# Patient Record
Sex: Male | Born: 1970
Health system: Southern US, Community
[De-identification: ages and names within clinical notes are randomized; demographics above are authoritative.]

## PROBLEM LIST (undated history)

## (undated) HISTORY — PX: EYE SURGERY: SHX253

## (undated) HISTORY — PX: BRAIN SURGERY: SHX531

## (undated) HISTORY — PX: FRACTURE SURGERY: SHX138

---

## 2008-09-06 ENCOUNTER — Inpatient Hospital Stay (HOSPITAL_COMMUNITY): Admission: EM | Admit: 2008-09-06 | Discharge: 2008-09-09 | Payer: Self-pay | Admitting: Emergency Medicine

## 2008-09-12 ENCOUNTER — Emergency Department (HOSPITAL_COMMUNITY): Admission: EM | Admit: 2008-09-12 | Discharge: 2008-09-12 | Payer: Self-pay | Admitting: Emergency Medicine

## 2008-09-14 ENCOUNTER — Emergency Department (HOSPITAL_COMMUNITY): Admission: EM | Admit: 2008-09-14 | Discharge: 2008-09-14 | Payer: Self-pay | Admitting: Emergency Medicine

## 2008-09-14 ENCOUNTER — Ambulatory Visit (HOSPITAL_COMMUNITY): Admission: RE | Admit: 2008-09-14 | Discharge: 2008-09-15 | Payer: Self-pay | Admitting: Otolaryngology

## 2008-11-01 ENCOUNTER — Encounter: Admission: RE | Admit: 2008-11-01 | Discharge: 2008-11-01 | Payer: Self-pay | Admitting: Neurosurgery

## 2009-01-21 ENCOUNTER — Emergency Department (HOSPITAL_COMMUNITY): Admission: EM | Admit: 2009-01-21 | Discharge: 2009-01-21 | Payer: Self-pay | Admitting: Emergency Medicine

## 2009-01-28 ENCOUNTER — Ambulatory Visit (HOSPITAL_COMMUNITY): Admission: RE | Admit: 2009-01-28 | Discharge: 2009-01-28 | Payer: Self-pay | Admitting: Orthopedic Surgery

## 2009-10-22 IMAGING — CT CT HEAD W/O CM
1 of 2 series · 13 of 30 positions shown, 17 images · non-contrast
Comparison: Head CT 09/06/2008.

10/04/2008 – CORRECTED ORDERING PHYSICIAN:  Due to an administrative error, this report was originally sent to the wrong attending physician.  The report now reflects the correct ordering physician.
CLINICAL DATA: Skull fracture.

 CT HEAD WITHOUT CONTRAST
TECHNIQUE: Contiguous axial images were obtained from the base of
 the skull through the vertex without contrast.

[Series 2: brain · axial · 0.49mm/px · z∈[+160,+318]mm · 13 of 36 slices shown, 17 images]
[im 3/36  brain]
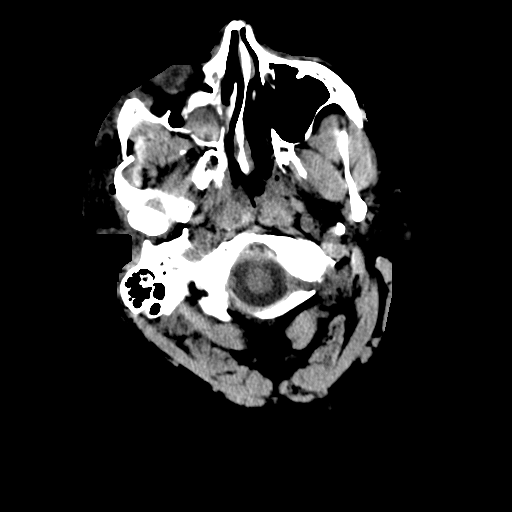
[im 3/36  bone]
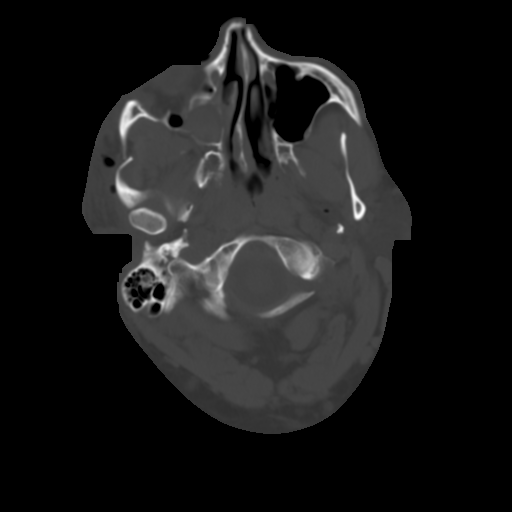
[im 6/36  brain]
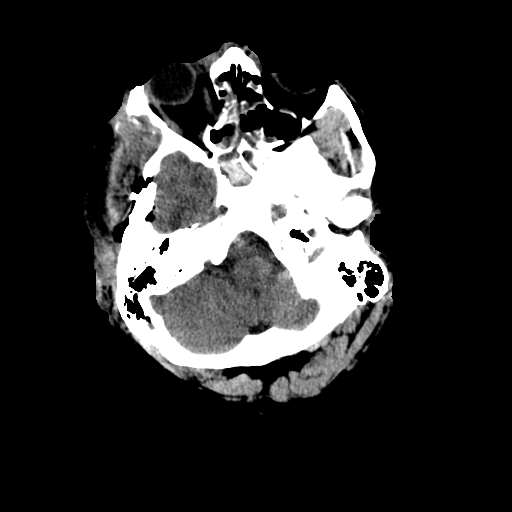
[im 8/36  brain]
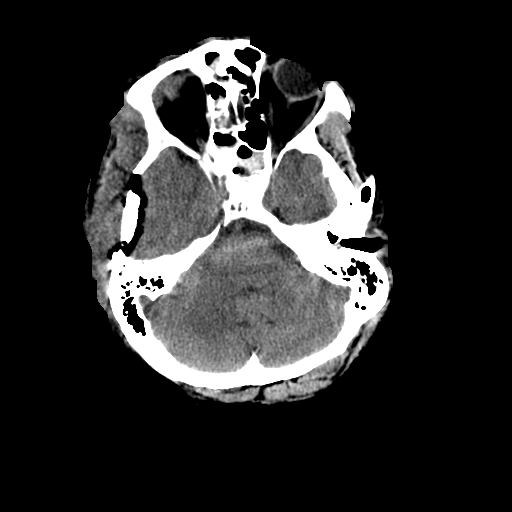
[im 11/36  brain]
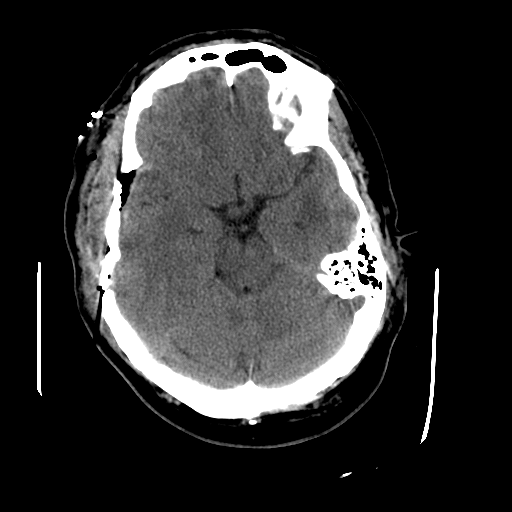
[im 13/36  brain]
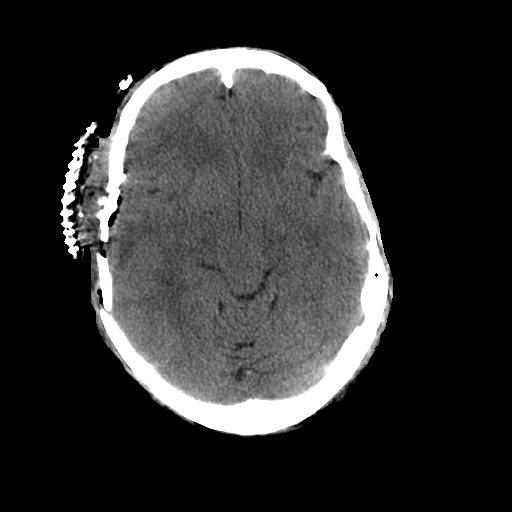
[im 13/36  bone]
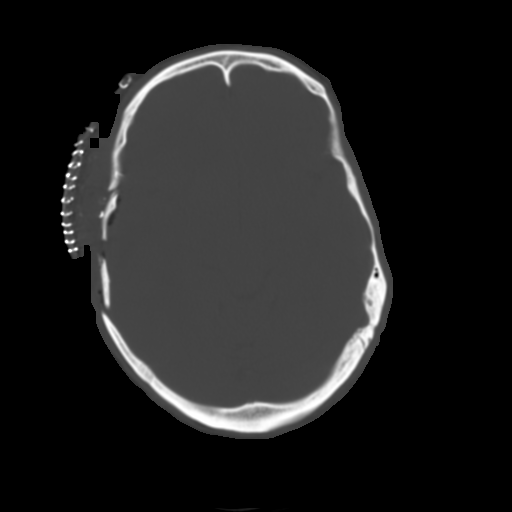
[im 16/36  brain]
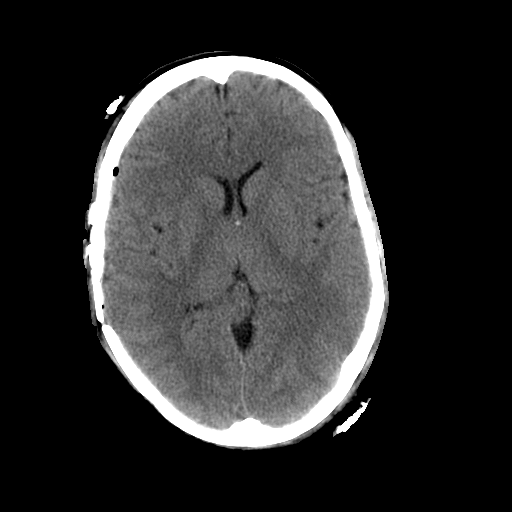
[im 18/36  brain]
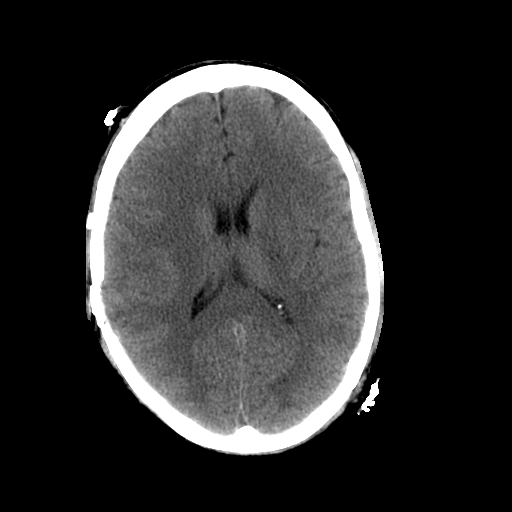
[im 21/36  brain]
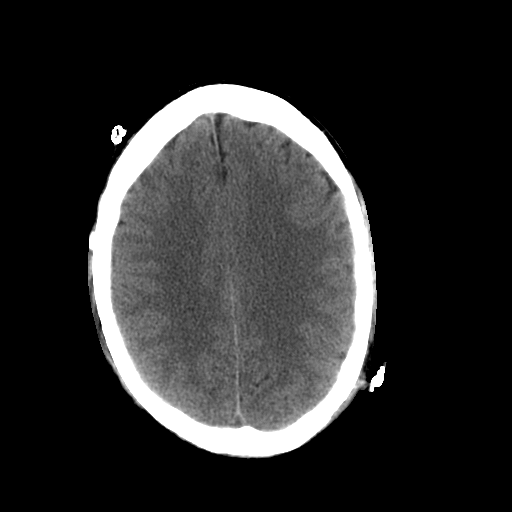
[im 23/36  brain]
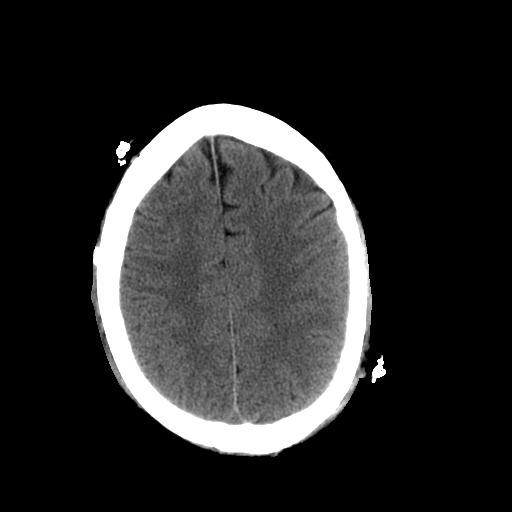
[im 23/36  bone]
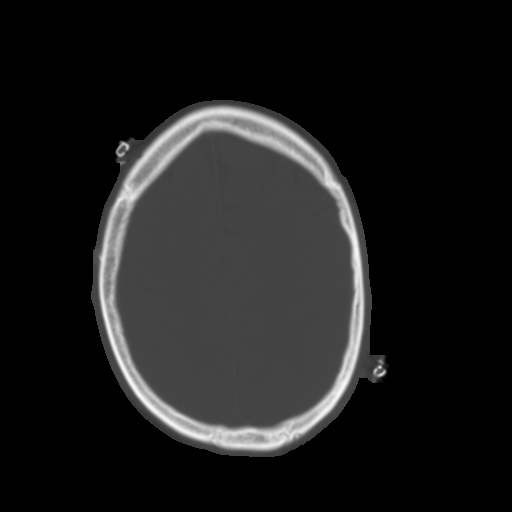
[im 26/36  brain]
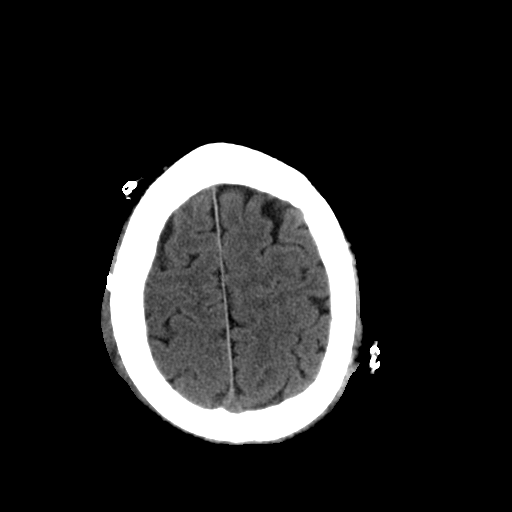
[im 28/36  brain]
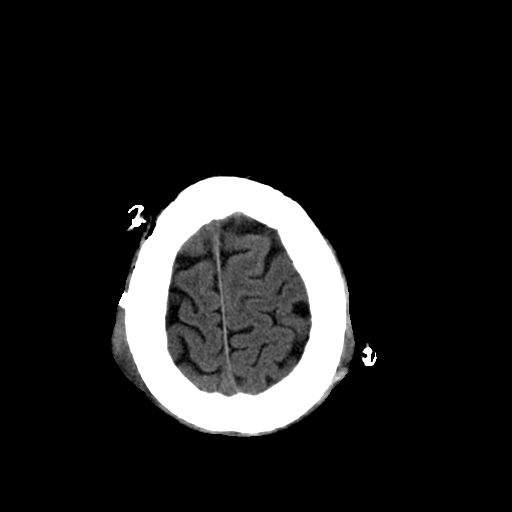
[im 31/36  brain]
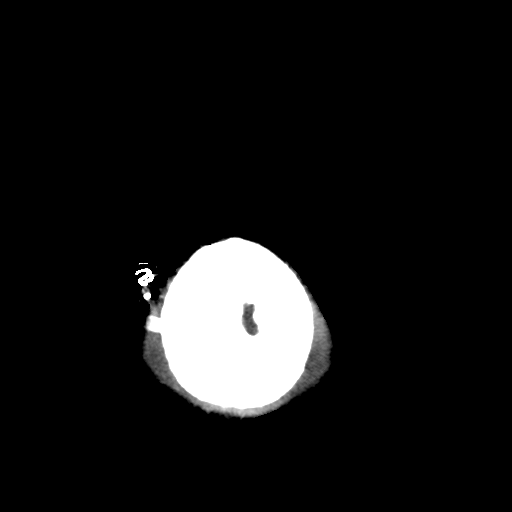
[im 33/36  brain]
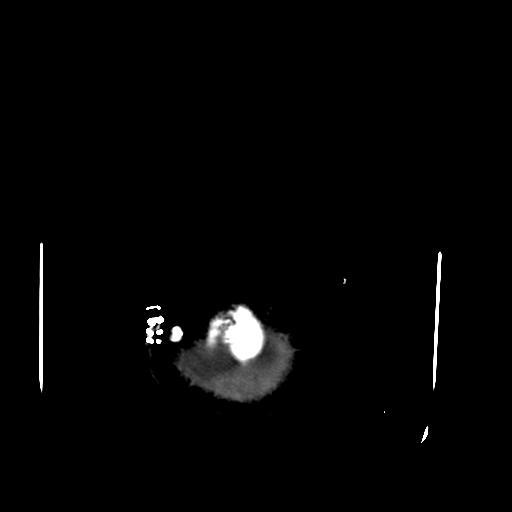
[im 33/36  bone]
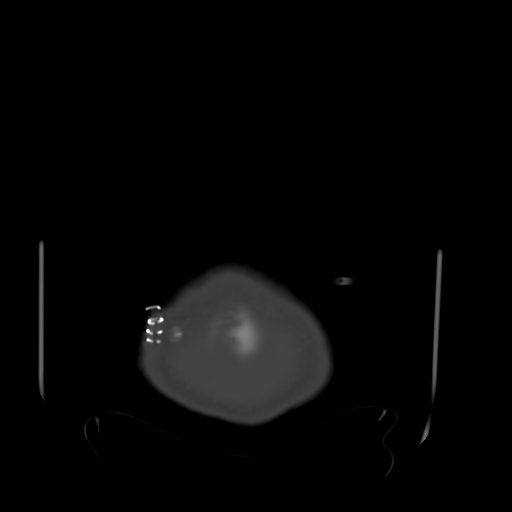

[13 of 30 positions shown; findings below may reference images not displayed]

FINDINGS: Postoperative change of fixation of right side skull
 fractures again noted. There is a small amount of pneumocephalus.
 Tiny amount of epidural blood seen on the prior exam is less
 conspicuous today. A focal area of hypoattenuation now visible in
 the right temporal lobe measuring 2.0 x 1.3 cm immediately
 subjacent to a fracture is likely due to infarction from contusion.
 No other evidence of infarct is identified. No subarachnoid
 hemorrhage, midline shift, hydrocephalus or evidence of is
 identified. Seizures and is now are noted. Multiple right sided
 facial fractures are partially visualized.
IMPRESSION: 1. Status post repair of multiple right side skull fractures.
 2. Small epidural hematoma is seen on the prior study is less
 conspicuous today.
 3. New focal area of hypoattenuation in the right temporal lobe
 not visible on the prior study and compatible with infarct due to
 contusion.

## 2009-12-15 IMAGING — CT CT HEAD W/O CM
2 series · 16 of 30 positions shown, 18 images · non-contrast
Comparison: 09/08/2008.

CLINICAL DATA: Status post repair of a right frontotemporal skull
fracture, right facial fractures and evacuation of a right extra-
axial hematoma.  The patient has right upper teeth numbness and
blurred vision.  Headache.

CT HEAD WITHOUT CONTRAST
TECHNIQUE: Contiguous axial images were obtained from the base of
the skull through the vertex without contrast.

[Series 3: bone windows · axial · 0.49mm/px · z∈[+41,+172]mm · 8 of 64 slices shown]
[im 7/64  bone]
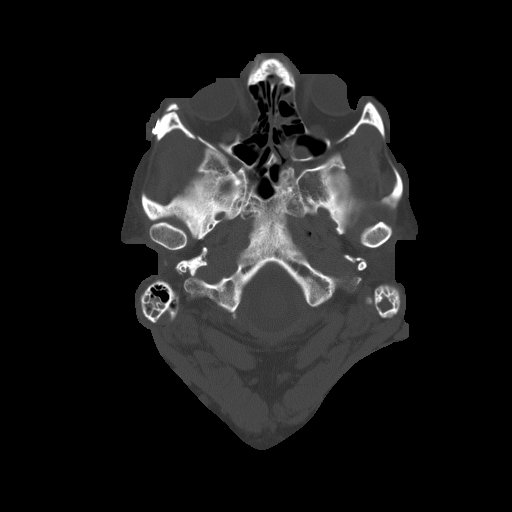
[im 14/64  bone]
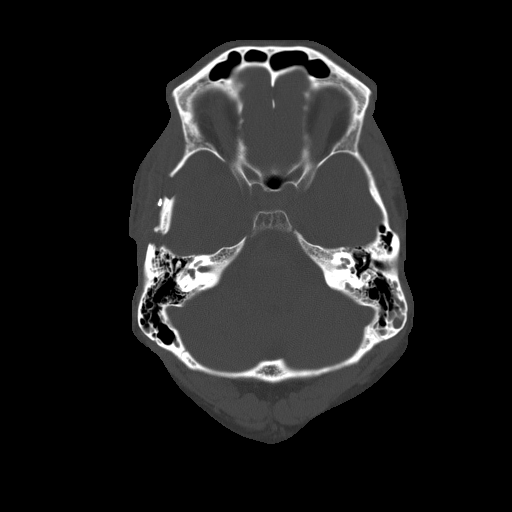
[im 20/64  bone]
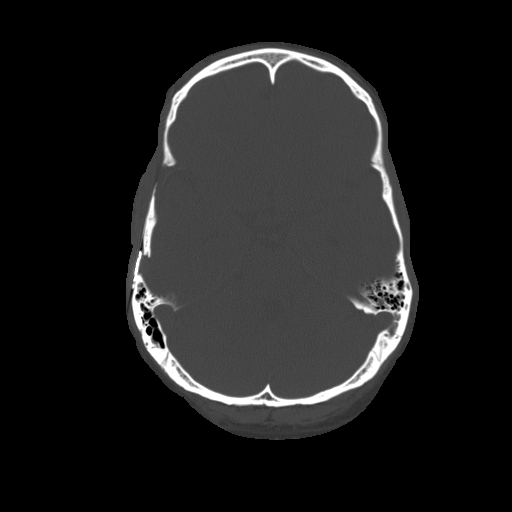
[im 27/64  bone]
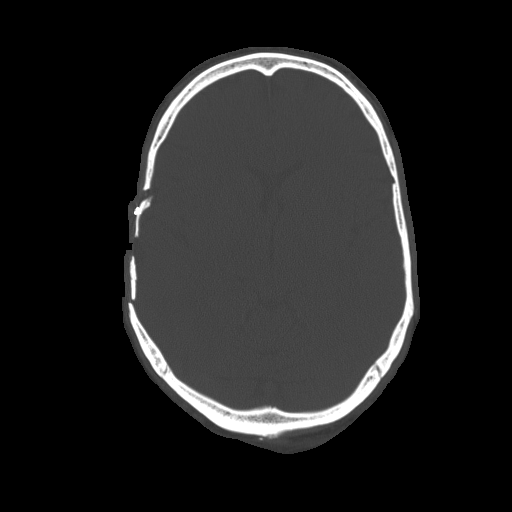
[im 37/64  bone]
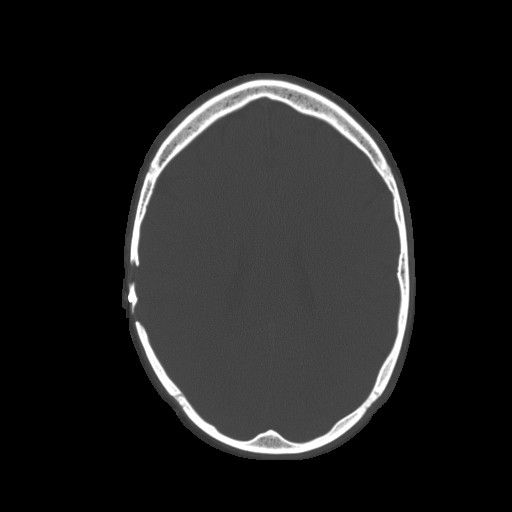
[im 44/64  bone]
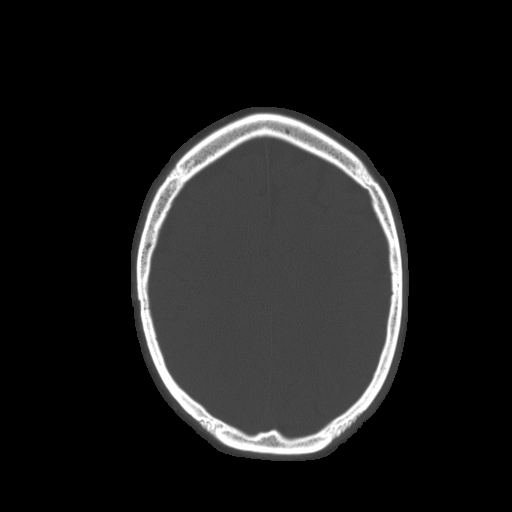
[im 50/64  bone]
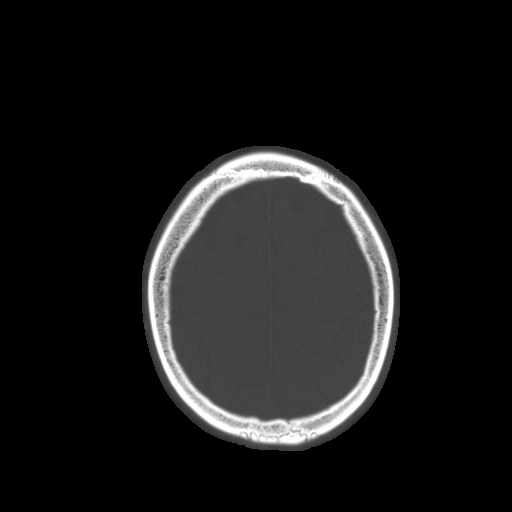
[im 57/64  bone]
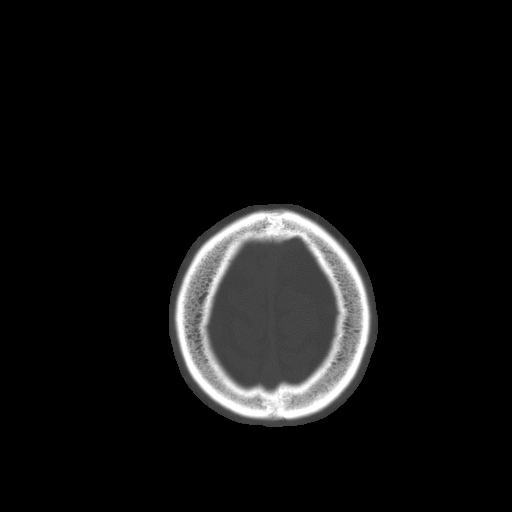

[Series 32: 3d filtered head · axial · 0.49mm/px · z∈[+42,+168]mm · 8 of 32 slices shown, 10 images]
[im 4/32  brain]
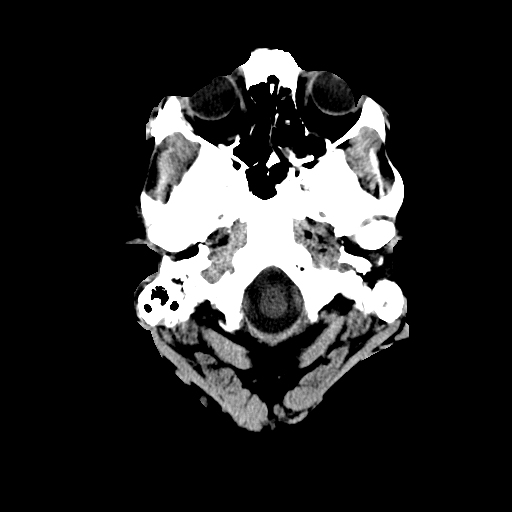
[im 4/32  bone]
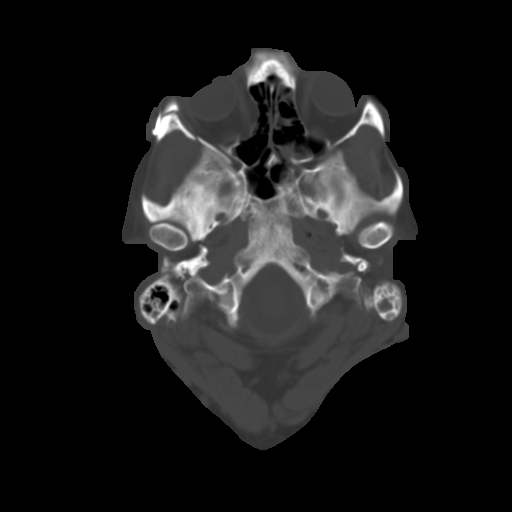
[im 7/32  brain]
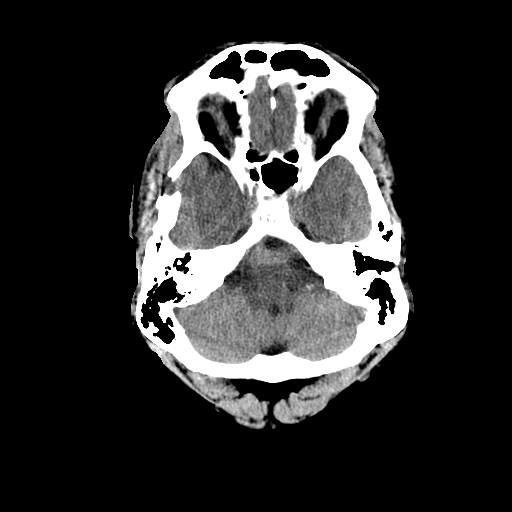
[im 11/32  brain]
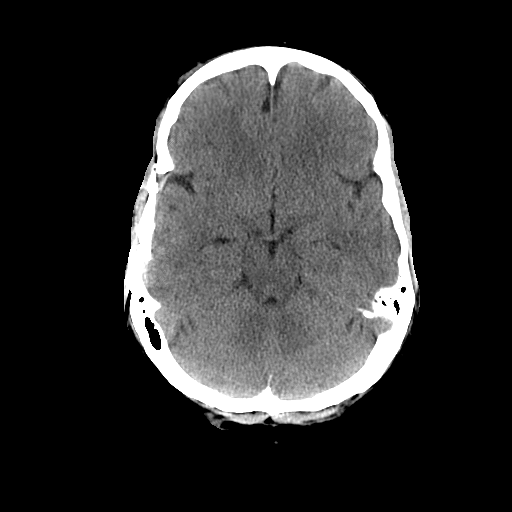
[im 14/32  brain]
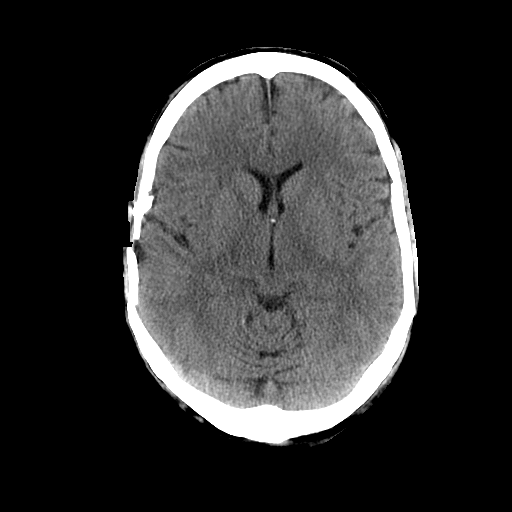
[im 18/32  brain]
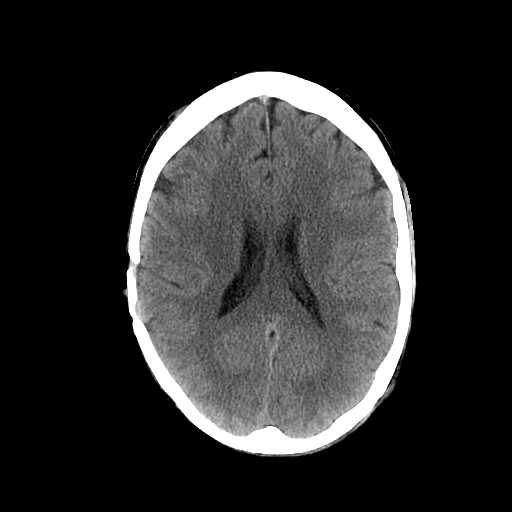
[im 18/32  bone]
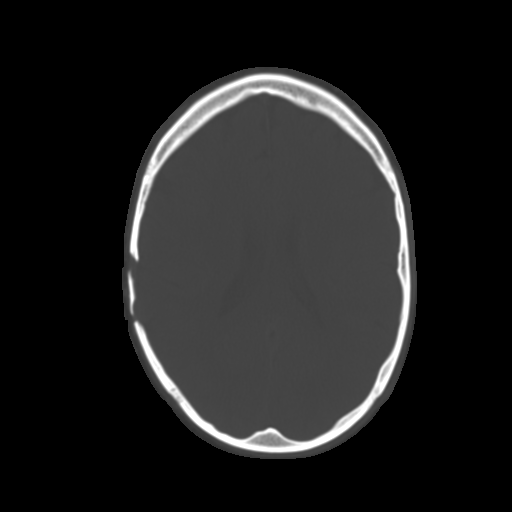
[im 21/32  brain]
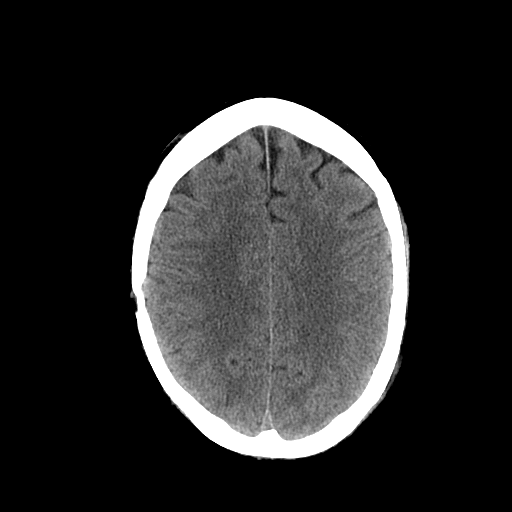
[im 25/32  brain]
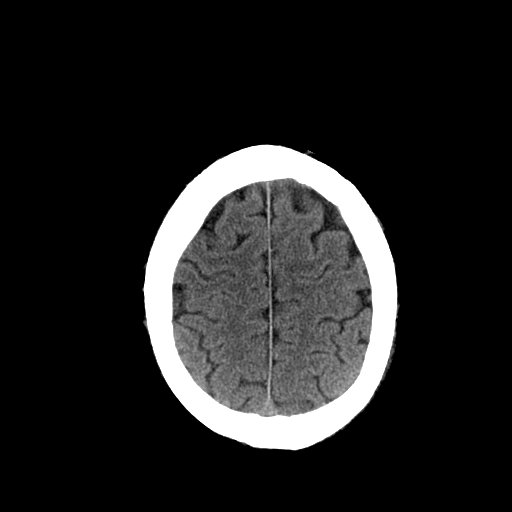
[im 28/32  brain]
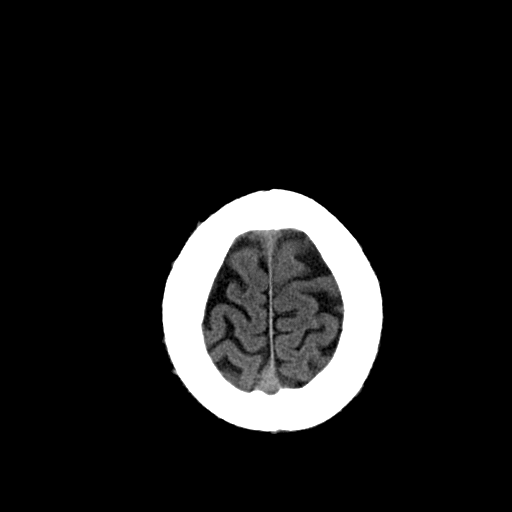

[16 of 30 positions shown; findings below may reference images not displayed]

FINDINGS: Surgical repair of the right frontotemporal skull
fracture is again demonstrated with absence of the previously
demonstrated overlying skin clips and overlying scalp soft tissue
swelling.  The previously demonstrated 2.0 x 1.3 cm area of low
density in the right temporal lobe is smaller, currently measuring
1.0 x 0.6 cm in maximum dimensions on image number 12.  The
remainder of the cerebral hemispheres and posterior fossa
structures have normal appearances.  The ventricles are normal in
size and position.  No extra axial blood is seen at this time.
Partially included and partially repaired right facial fractures
are again demonstrated.  Mild left ethmoid sinus mucosal thickening
and small rounded area of soft tissue density in the left maxillary
sinus.
IMPRESSION: 1.  Interval decrease in the size of a small area of
encephalomalacia in the right temporal lobe.
2.  No extra-axial blood seen at this time.
3.  Mild chronic left ethmoid sinusitis and small left maxillary
sinus retention cyst.
4.  Previously demonstrated right skull and facial bone fractures.

## 2010-07-19 LAB — COMPREHENSIVE METABOLIC PANEL
Albumin: 4.3 g/dL (ref 3.5–5.2)
Alkaline Phosphatase: 52 U/L (ref 39–117)
BUN: 11 mg/dL (ref 6–23)
CO2: 23 mEq/L (ref 19–32)
GFR calc Af Amer: >60 mL/min (ref >60)
Sodium: 133 mEq/L — ABNORMAL LOW (ref 135–145)
Total Protein: 7.2 g/dL (ref 6.0–8.3)

## 2010-07-19 LAB — DIFFERENTIAL
Basophils Absolute: 0 10*3/uL (ref 0.0–0.1)
Basophils Relative: 0 % (ref 0–1)
Monocytes Absolute: 0.9 10*3/uL (ref 0.1–1.0)
Monocytes Relative: 11 % (ref 3–12)

## 2010-07-19 LAB — CBC
HCT: 39.1 % (ref 39.0–52.0)
Hemoglobin: 13.5 g/dL (ref 13.0–17.0)
MCHC: 34.5 g/dL (ref 30.0–36.0)
WBC: 8.2 10*3/uL (ref 4.0–10.5)

## 2010-07-23 LAB — CBC
Hemoglobin: 9.7 g/dL — ABNORMAL LOW (ref 13.0–17.0)
MCHC: 34.3 g/dL (ref 30.0–36.0)
Platelets: 410 10*3/uL — ABNORMAL HIGH (ref 150–400)
RBC: 3.04 MIL/uL — ABNORMAL LOW (ref 4.22–5.81)

## 2010-07-24 LAB — CBC
HCT: 22.6 % — ABNORMAL LOW (ref 39.0–52.0)
HCT: 23.3 % — ABNORMAL LOW (ref 39.0–52.0)
HCT: 23.5 % — ABNORMAL LOW (ref 39.0–52.0)
HCT: 40.7 % (ref 39.0–52.0)
Hemoglobin: 13.9 g/dL (ref 13.0–17.0)
Hemoglobin: 7.9 g/dL — CL (ref 13.0–17.0)
MCHC: 34.1 g/dL (ref 30.0–36.0)
MCHC: 34.9 g/dL (ref 30.0–36.0)
MCV: 90.3 fL (ref 78.0–100.0)
MCV: 91.4 fL (ref 78.0–100.0)
MCV: 92.4 fL (ref 78.0–100.0)
MCV: 92.8 fL (ref 78.0–100.0)
Platelets: 143 10*3/uL — ABNORMAL LOW (ref 150–400)
Platelets: 201 10*3/uL (ref 150–400)
Platelets: 227 10*3/uL (ref 150–400)
RBC: 4.43 MIL/uL (ref 4.22–5.81)
RDW: 12.1 % (ref 11.5–15.5)
RDW: 12.4 % (ref 11.5–15.5)
RDW: 12.4 % (ref 11.5–15.5)
RDW: 12.5 % (ref 11.5–15.5)
WBC: 10.4 10*3/uL (ref 4.0–10.5)
WBC: 18.5 10*3/uL — ABNORMAL HIGH (ref 4.0–10.5)
WBC: 9.9 10*3/uL (ref 4.0–10.5)

## 2010-07-24 LAB — PROTIME-INR
INR: 1.3 (ref 0.00–1.49)
Prothrombin Time: 14.2 seconds (ref 11.6–15.2)
Prothrombin Time: 16.1 seconds — ABNORMAL HIGH (ref 11.6–15.2)
Prothrombin Time: 17.4 seconds — ABNORMAL HIGH (ref 11.6–15.2)

## 2010-07-24 LAB — BASIC METABOLIC PANEL
BUN: 5 mg/dL — ABNORMAL LOW (ref 6–23)
Creatinine, Ser: 0.81 mg/dL (ref 0.4–1.5)
GFR calc non Af Amer: >60 mL/min (ref >60)
Glucose, Bld: 135 mg/dL — ABNORMAL HIGH (ref 70–99)
Potassium: 3.7 mEq/L (ref 3.5–5.1)

## 2010-07-24 LAB — POCT I-STAT, CHEM 8
Chloride: 105 mEq/L (ref 96–112)
HCT: 42 % (ref 39.0–52.0)
Potassium: 3.1 mEq/L — ABNORMAL LOW (ref 3.5–5.1)

## 2010-07-24 LAB — ABO/RH: ABO/RH(D): B NEG

## 2010-07-24 LAB — APTT: aPTT: 36 seconds (ref 24–37)

## 2010-07-24 LAB — TYPE AND SCREEN: ABO/RH(D): B NEG

## 2010-07-24 LAB — CROSSMATCH

## 2010-07-24 LAB — HEMOGLOBIN AND HEMATOCRIT, BLOOD: Hemoglobin: 7.8 g/dL — CL (ref 13.0–17.0)
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
## 2010-08-28 NOTE — Op Note (Signed)
NAME:  Adrian Arnold, Adrian Arnold                   ACCOUNT NO.:  [id number]   MEDICAL RECORD NO.:  [id number]          PATIENT TYPE:  INP   LOCATION:  3106                         FACILITY:  [facility]   PHYSICIAN:  Reinaldo Meeker, M.D. DATE OF BIRTH:  1971/02/03   DATE OF PROCEDURE:  09/06/2008  DATE OF DISCHARGE:                               OPERATIVE REPORT   PREOPERATIVE DIAGNOSIS:  Depressed skull fracture, right  temporoparietal.   POSTOPERATIVE DIAGNOSIS:  Depressed skull fracture, right  temporoparietal.   PROCEDURE:  Elevation of right frontotemporal parietal depressed skull  fracture with evacuation of epidural and subdural hematoma.   SURGEON:  Reinaldo Meeker, MD.   ADDITIONAL PROCEDURE:  Repair of left parietal laceration.   PROCEDURE IN DETAIL:  After placed in the supine position, the patient's  scalp on the left side by his laceration was shaved and cleaned and  prepped.  The incision was explored and the hematoma was evacuated.  A  single line of staples were placed along the laceration line.  This gave  excellent hemostasis.  The patient was then rolled slightly towards the  left side with a point along his right, and the right scalp and hair  region were shaved, prepped and draped.  The very large V-shaped  laceration was simply elevated towards the posterior.  We then dissected  free the edges of the cranium, so that we could isolate the depressed  skull fracture region.  Once this was done, a self-retaining retractors  and towel clips were used to expose the area on all margins.  The  depressed skull fracture was evaluated and few of the pieces could be  removed.  This gave [place] access to work under some of the larger fragments  and the skull fracture was removed in a piecemeal fashion.  There was  one large piece with a very sharply leading edge that had lacerated the  dura and actually lacerated the cortical surface.  This was removed and  there was firm amount of brisk  venous bleeding.  The dura was opened  somewhat larger, so we could evaluate and coagulate the area.  The  subdural clot was evacuated in this region and the small bleeding vein  was coagulated and then Gelfoam was placed upon it.  Exploration under  the rest that have suppressed skull fracture showed some epidural clots  and this was removed without difficulty.  The dura was then closed once  more.  We checked around the edges of the cranial defect to make sure  there were no more depressed skull fragments, and there were none could  be identified.  The larger fragments of bone were then reapproximated to  cover approximately 80-85% of the cranial defect.  These were secured  above to each other into the cranium with small screws in the wound  plates and 4 mm screws.  A subdural drain was then left in the subdural  space and brought in through a separate stab wound incision.  The long  laceration was then closed with inverted Vicryl on the galea and  staples  on the skin.  We stopped short of the region of the eye, so that the  maxillofacial surgery could come in and close that in a separate  procedure.  During the procedure, I had anticipated the patient will be  extubated and taken to recovery room.           ______________________________  Reinaldo Meeker, M.D.    ROK/MEDQ  D:  09/06/2008  T:  09/07/2008  Job:  [id number]

## 2019-09-09 ENCOUNTER — Ambulatory Visit: Payer: BC Managed Care – PPO | Admitting: Family Medicine

## 2019-09-09 ENCOUNTER — Encounter: Payer: Self-pay | Admitting: Family Medicine

## 2019-09-09 ENCOUNTER — Other Ambulatory Visit: Payer: Self-pay

## 2019-09-09 VITALS — BP 108/73 | HR 70 | Temp 97.5°F | Resp 16 | Ht 69.5 in | Wt 222.2 lb

## 2019-09-09 DIAGNOSIS — H8110 Benign paroxysmal vertigo, unspecified ear: Secondary | ICD-10-CM | POA: Insufficient documentation

## 2019-09-09 DIAGNOSIS — Z720 Tobacco use: Secondary | ICD-10-CM | POA: Insufficient documentation

## 2019-09-09 MED ORDER — MECLIZINE HCL 25 MG PO TABS: 50.0000 mg | ORAL_TABLET | Freq: Three times a day (TID) | ORAL | 1 refills | Status: DC | PRN

## 2019-09-09 MED ORDER — FLUTICASONE PROPIONATE 50 MCG/ACT NA SUSP: 2.0000 | Freq: Every day | NASAL | 6 refills | Status: DC

## 2019-09-09 NOTE — Assessment & Plan Note (Signed)
3-5 minute discussion regarding the harms of tobacco use, the benefits of cessation, and methods of cessation Discussed that there are medication options to help with cessation Patient is currently precontemplative  Will reassess at next visit  

## 2019-09-09 NOTE — Progress Notes (Signed)
New patient visit   Patient: Adrian Arnold   DOB: Nov 07, 1970   49 y.o. Male  MRN: [id number] Visit Date: 09/09/2019  I,Sulibeya S Dimas,acting as a scribe for Lavon Paganini, MD.,have documented all relevant documentation on the behalf of Lavon Paganini, MD,as directed by  Lavon Paganini, MD while in the presence of Lavon Paganini, MD.  Today's healthcare provider: Lavon Paganini, MD   Chief Complaint  Patient presents with  . New Patient (Initial Visit)  . Dizziness   Subjective    Adrian Arnold is a 49 y.o. male who presents today as a new patient to establish care.  HPI   Dizziness  He reports new onset dizziness. He describes it as feeling light headed, feeling like room is spinning and feeling unbalanced, occurs intermittently, and typically lasts minutes.  It typically occurs when he is lying back from sitting position and sitting up from lying position. Or turning head quickly. It is usually relieved by sitting still. He has not started new medications around the time the dizziness started.  Never had this previously  Associated symptoms: No hearing loss No tinnitus  No chest discomfort No heart palpitations  No heart racing No numbness or tingling of extremities  Yes nausea No vomiting  No speech difficulty No visual changes    Wt Readings from Last 3 Encounters:  09/09/19 222 lb 3.2 oz (100.8 kg)    BP Readings from Last 3 Encounters:  09/09/19 108/73      Lab Results  Component Value Date   WBC 8.2 01/28/2009   HGB 13.5 01/28/2009   HCT 39.1 01/28/2009   MCV 91.3 01/28/2009   PLT 266 01/28/2009   Lab Results  Component Value Date   NA 133 (L) 01/28/2009   K 3.4 (L) 01/28/2009   CO2 23 01/28/2009   BUN 11 01/28/2009   CREATININE 0.79 01/28/2009   CALCIUM 9.0 01/28/2009   GLUCOSE 121 (H) 01/28/2009     ---------------------------------------------------------------------------------------------------   History reviewed. No pertinent  past medical history. Past Surgical History:  Procedure Laterality Date  . BRAIN SURGERY     skull fusion, nothing intracranial  . EYE SURGERY    . FRACTURE SURGERY     Family Status  Relation Name Status  . Mother  Alive  . Father  Deceased at age 77       prostate cancer  . Sister  Alive  . Brother  Alive  . Son  Alive  . MGF  Deceased  . PGM  Deceased  . Mat Uncle  (Not Specified)  . Neg Hx  (Not Specified)   Family History  Problem Relation Age of Onset  . Heart attack Mother 39  . Stroke Mother 26  . Prostate cancer Father 36  . Gallbladder disease Sister   . Gallbladder disease Brother   . Heart attack Maternal Grandfather   . Stroke Maternal Grandfather   . Heart attack Paternal Grandmother   . Colon cancer Maternal Uncle   . Breast cancer Neg Hx    Social History   Socioeconomic History  . Marital status: Married    Spouse name: Not on file  . Number of children: 1  . Years of education: Not on file  . Highest education level: Not on file  Occupational History  . Not on file  Tobacco Use  . Smoking status: Former Smoker    Packs/day: 1.00    Years: 20.00    Pack years: 20.00  Types: Cigarettes    Quit date: 2015    Years since quitting: 6.4  . Smokeless tobacco: Current User    Types: Snuff  Substance and Sexual Activity  . Alcohol use: Yes    Alcohol/week: 2.0 standard drinks    Types: 2 Cans of beer per week  . Drug use: Never  . Sexual activity: Yes    Partners: Female    Birth control/protection: None  Other Topics Concern  . Not on file  Social History Narrative  . Not on file   Social Determinants of Health   Financial Resource Strain:   . Difficulty of Paying Living Expenses:   Food Insecurity:   . Worried About Charity fundraiser in the Last Year:   . Arboriculturist in the Last Year:   Transportation Needs:   . Film/video editor (Medical):   Marland Kitchen Lack of Transportation (Non-Medical):   Physical Activity:   . Days of  Exercise per Week:   . Minutes of Exercise per Session:   Stress:   . Feeling of Stress :   Social Connections:   . Frequency of Communication with Friends and Family:   . Frequency of Social Gatherings with Friends and Family:   . Attends Religious Services:   . Active Member of Clubs or Organizations:   . Attends Archivist Meetings:   Marland Kitchen Marital Status:    No outpatient medications prior to visit.   No facility-administered medications prior to visit.   No Known Allergies   There is no immunization history on file for this patient.  Health Maintenance  Topic Date Due  . COVID-19 Vaccine (1) Never done  . HIV Screening  Never done  . TETANUS/TDAP  Never done  . COLONOSCOPY  Never done  . INFLUENZA VACCINE  11/14/2019    Patient Care Team: Virginia Crews, MD as PCP - General (Family Medicine)  Review of Systems  Constitutional: Negative.   HENT: Negative.   Eyes: Negative.   Respiratory: Negative.   Cardiovascular: Negative.   Gastrointestinal: Negative.   Endocrine: Negative.   Genitourinary: Negative.   Musculoskeletal: Positive for arthralgias and myalgias.  Skin: Negative.   Allergic/Immunologic: Negative.   Neurological: Positive for dizziness and light-headedness.  Hematological: Negative.   Psychiatric/Behavioral: Negative.      Objective    BP 108/73 (BP Location: Left Arm, Patient Position: Sitting, Cuff Size: Large)   Pulse 70   Temp (!) 97.5 F (36.4 C) (Temporal)   Resp 16   Ht 5' 9.5" (1.765 m)   Wt 222 lb 3.2 oz (100.8 kg)   BMI 32.34 kg/m  Physical Exam Vitals reviewed.  Constitutional:      General: He is not in acute distress.    Appearance: Normal appearance. He is not diaphoretic.  HENT:     Head: Normocephalic and atraumatic.     Right Ear: Tympanic membrane, ear canal and external ear normal.     Left Ear: Tympanic membrane, ear canal and external ear normal.     Ears:     Comments: +Dix Hallpike     Mouth/Throat:     Mouth: Mucous membranes are moist.     Pharynx: Oropharynx is clear.  Eyes:     General: No scleral icterus.    Extraocular Movements: Extraocular movements intact.     Right eye: No nystagmus.     Left eye: No nystagmus.     Conjunctiva/sclera: Conjunctivae normal.  Pupils: Pupils are equal, round, and reactive to light.  Cardiovascular:     Rate and Rhythm: Normal rate and regular rhythm.     Pulses: Normal pulses.     Heart sounds: Normal heart sounds. No murmur.  Pulmonary:     Effort: Pulmonary effort is normal. No respiratory distress.     Breath sounds: Normal breath sounds. No wheezing or rhonchi.  Abdominal:     General: Abdomen is flat. There is no distension.     Palpations: Abdomen is soft.     Tenderness: There is no abdominal tenderness.  Musculoskeletal:     Cervical back: Normal range of motion and neck supple.     Right lower leg: No edema.     Left lower leg: No edema.  Skin:    General: Skin is warm and dry.     Findings: No rash.  Neurological:     General: No focal deficit present.     Mental Status: He is alert and oriented to person, place, and time.     Cranial Nerves: No cranial nerve deficit.     Sensory: No sensory deficit.     Motor: No weakness.     Coordination: Romberg sign negative. Coordination normal. Finger-Nose-Finger Test normal.     Gait: Gait normal.  Psychiatric:        Mood and Affect: Mood normal.        Behavior: Behavior normal.        Thought Content: Thought content normal.    Depression Screen PHQ 2/9 Scores 09/09/2019  PHQ - 2 Score 1  PHQ- 9 Score 3   No results found for any visits on 09/09/19.  Assessment & Plan      Problem List Items Addressed This Visit      Nervous and Auditory   Benign paroxysmal positional vertigo - Primary    History and exam consistent with BPPV Neuro intact +Dix hallpike Will treat with meclizine and flonase Consider adding zofran if nausea worsens Discussed  fall precautions and return precautions      Relevant Medications   fluticasone (FLONASE) 50 MCG/ACT nasal spray   meclizine (ANTIVERT) 25 MG tablet     Other   Chewing tobacco use    3-5 minute discussion regarding the harms of tobacco use, the benefits of cessation, and methods of cessation Discussed that there are medication options to help with cessation Patient is currently precontemplative  Will reassess at next visit         Total time spent on today's visit was greater than 30 minutes, including both face-to-face time and nonface-to-face time personally spent on review of chart (labs and imaging), discussing labs and goals, discussing further work-up, treatment options, referrals to specialist if needed, reviewing outside records of pertinent, answering patient's questions, and coordinating care.   Return for as scheduled.     I, Lavon Paganini, MD, have reviewed all documentation for this visit. The documentation on 09/09/19 for the exam, diagnosis, procedures, and orders are all accurate and complete.   Zanovia Rotz, Dionne Bucy, MD, MPH [facility]

## 2019-09-09 NOTE — Assessment & Plan Note (Signed)
History and exam consistent with BPPV Neuro intact +Dix hallpike Will treat with meclizine and flonase Consider adding zofran if nausea worsens Discussed fall precautions and return precautions

## 2019-09-09 NOTE — Patient Instructions (Addendum)
Benign Positional Vertigo Vertigo is the feeling that you or your surroundings are moving when they are not. Benign positional vertigo is the most common form of vertigo. This is usually a harmless condition (benign). This condition is positional. This means that symptoms are triggered by certain movements and positions. This condition can be dangerous if it occurs while you are doing something that could cause harm to you or others. This includes activities such as driving or operating machinery. What are the causes? In many cases, the cause of this condition is not known. It may be caused by a disturbance in an area of the inner ear that helps your brain to sense movement and balance. This disturbance can be caused by:  Viral infection (labyrinthitis).  Head injury.  Repetitive motion, such as jumping, dancing, or running. What increases the risk? You are more likely to develop this condition if:  You are a woman.  You are 50 years of age or older. What are the signs or symptoms? Symptoms of this condition usually happen when you move your head or your eyes in different directions. Symptoms may start suddenly, and usually last for less than a minute. They include:  Loss of balance and falling.  Feeling like you are spinning or moving.  Feeling like your surroundings are spinning or moving.  Nausea and vomiting.  Blurred vision.  Dizziness.  Involuntary eye movement (nystagmus). Symptoms can be mild and cause only minor problems, or they can be severe and interfere with daily life. Episodes of benign positional vertigo may return (recur) over time. Symptoms may improve over time. How is this diagnosed? This condition may be diagnosed based on:  Your medical history.  Physical exam of the head, neck, and ears.  Tests, such as: ? MRI. ? CT scan. ? Eye movement tests. Your health care provider may ask you to change positions quickly while he or she watches you for symptoms  of benign positional vertigo, such as nystagmus. Eye movement may be tested with a variety of exams that are designed to evaluate or stimulate vertigo. ? An electroencephalogram (EEG). This records electrical activity in your brain. ? Hearing tests. You may be referred to a health care provider who specializes in ear, nose, and throat (ENT) problems (otolaryngologist) or a provider who specializes in disorders of the nervous system (neurologist). How is this treated?  This condition may be treated in a session in which your health care provider moves your head in specific positions to adjust your inner ear back to normal. Treatment for this condition may take several sessions. Surgery may be needed in severe cases, but this is rare. In some cases, benign positional vertigo may resolve on its own in 2-4 weeks. Follow these instructions at home: Safety  Move slowly. Avoid sudden body or head movements or certain positions, as told by your health care provider.  Avoid driving until your health care provider says it is safe for you to do so.  Avoid operating heavy machinery until your health care provider says it is safe for you to do so.  Avoid doing any tasks that would be dangerous to you or others if vertigo occurs.  If you have trouble walking or keeping your balance, try using a cane for stability. If you feel dizzy or unstable, sit down right away.  Return to your normal activities as told by your health care provider. Ask your health care provider what activities are safe for you. General instructions  Take over-the-counter   and prescription medicines only as told by your health care provider.  Drink enough fluid to keep your urine pale yellow.  Keep all follow-up visits as told by your health care provider. This is important. Contact a health care provider if:  You have a fever.  Your condition gets worse or you develop new symptoms.  Your family or friends notice any  behavioral changes.  You have nausea or vomiting that gets worse.  You have numbness or a "pins and needles" sensation. Get help right away if you:  Have difficulty speaking or moving.  Are always dizzy.  Faint.  Develop severe headaches.  Have weakness in your legs or arms.  Have changes in your hearing or vision.  Develop a stiff neck.  Develop sensitivity to light. Summary  Vertigo is the feeling that you or your surroundings are moving when they are not. Benign positional vertigo is the most common form of vertigo.  The cause of this condition is not known. It may be caused by a disturbance in an area of the inner ear that helps your brain to sense movement and balance.  Symptoms include loss of balance and falling, feeling that you or your surroundings are moving, nausea and vomiting, and blurred vision.  This condition can be diagnosed based on symptoms, physical exam, and other tests, such as MRI, CT scan, eye movement tests, and hearing tests.  Follow safety instructions as told by your health care provider. You will also be told when to contact your health care provider in case of problems. This information is not intended to replace advice given to you by your health care provider. Make sure you discuss any questions you have with your health care provider. Document Revised: 09/10/2017 Document Reviewed: 09/10/2017 Elsevier Patient Education  2020 Else[employer] Inc.  How to Perform the Epley Maneuver The Epley maneuver is an exercise that relieves symptoms of vertigo. Vertigo is the feeling that you or your surroundings are moving when they are not. When you feel vertigo, you may feel like the room is spinning and have trouble walking. Dizziness is a little different than vertigo. When you are dizzy, you may feel unsteady or light-headed. You can do this maneuver at home whenever you have symptoms of vertigo. You can do it up to 3 times a day until your symptoms go  away. Even though the Epley maneuver may relieve your vertigo for a few weeks, it is possible that your symptoms will return. This maneuver relieves vertigo, but it does not relieve dizziness. What are the risks? If it is done correctly, the Epley maneuver is considered safe. Sometimes it can lead to dizziness or nausea that goes away after a short time. If you develop other symptoms, such as changes in vision, weakness, or numbness, stop doing the maneuver and call your health care provider. How to perform the Epley maneuver 1. Sit on the edge of a bed or table with your back straight and your legs extended or hanging over the edge of the bed or table. 2. Turn your head halfway toward the affected ear or side. 3. Lie backward quickly with your head turned until you are lying flat on your back. You may want to position a pillow under your shoulders. 4. Hold this position for 30 seconds. You may experience an attack of vertigo. This is normal. 5. Turn your head to the opposite direction until your unaffected ear is facing the floor. 6. Hold this position for 30 seconds. You   may experience an attack of vertigo. This is normal. Hold this position until the vertigo stops. 7. Turn your whole body to the same side as your head. Hold for another 30 seconds. 8. Sit back up. You can repeat this exercise up to 3 times a day. Follow these instructions at home:  After doing the Epley maneuver, you can return to your normal activities.  Ask your health care provider if there is anything you should do at home to prevent vertigo. He or she may recommend that you: ? Keep your head raised (elevated) with two or more pillows while you sleep. ? Do not sleep on the side of your affected ear. ? Get up slowly from bed. ? Avoid sudden movements during the day. ? Avoid extreme head movement, like looking up or bending over. Contact a health care provider if:  Your vertigo gets worse.  You have other symptoms,  including: ? Nausea. ? Vomiting. ? Headache. Get help right away if:  You have vision changes.  You have a severe or worsening headache or neck pain.  You cannot stop vomiting.  You have new numbness or weakness in any part of your body. Summary  Vertigo is the feeling that you or your surroundings are moving when they are not.  The Epley maneuver is an exercise that relieves symptoms of vertigo.  If the Epley maneuver is done correctly, it is considered safe. You can do it up to 3 times a day. This information is not intended to replace advice given to you by your health care provider. Make sure you discuss any questions you have with your health care provider. Document Revised: 03/14/2017 Document Reviewed: 02/20/2016 Elsevier Patient Education  2020 Elsevier Inc.   

## 2019-10-07 ENCOUNTER — Encounter: Payer: Self-pay | Admitting: Family Medicine

## 2019-10-11 ENCOUNTER — Encounter: Payer: Self-pay | Admitting: Family Medicine

## 2019-10-15 ENCOUNTER — Telehealth: Payer: Self-pay

## 2019-10-15 ENCOUNTER — Other Ambulatory Visit: Payer: Self-pay | Admitting: Family Medicine

## 2019-10-15 DIAGNOSIS — H8110 Benign paroxysmal vertigo, unspecified ear: Secondary | ICD-10-CM

## 2019-10-15 MED ORDER — MECLIZINE HCL 25 MG PO TABS: 50.0000 mg | ORAL_TABLET | Freq: Three times a day (TID) | ORAL | 1 refills | Status: DC | PRN

## 2019-10-15 NOTE — Telephone Encounter (Signed)
Left message advising pt he can schedule a physical with another provider if he needs it sooner.  PEC please offer an appointment with a different provider.when he calls back.   Thanks,   -Mickel Baas

## 2019-10-15 NOTE — Telephone Encounter (Signed)
  Patient requesting meclizine (ANTIVERT) 25 MG tablet, informed patient please allow 48 to 72 hour turn around time  [id number] - [place], [place] - [address] Phone:  [phone]  Fax:  [phone]

## 2019-10-15 NOTE — Telephone Encounter (Signed)
Requested medication (s) are due for refill today: no  Requested medication (s) are on the active medication list: yes  Last refill: 09/09/2019  Future visit scheduled: yes  Notes to clinic:  this refill cannot be delegated    Requested Prescriptions  Pending Prescriptions Disp Refills   meclizine (ANTIVERT) 25 MG tablet 60 tablet 1    Sig: Take 2 tablets (50 mg total) by mouth 3 (three) times daily as needed.      Not Delegated - Gastroenterology: Antiemetics Failed - 10/15/2019  9:02 AM      Failed - This refill cannot be delegated      Passed - Valid encounter within last 6 months    Recent Outpatient Visits           1 month ago Benign paroxysmal positional vertigo, unspecified laterality   [facility] Bacigalupo, Dionne Bucy, MD       Future Appointments             In 4 months Bacigalupo, Dionne Bucy, MD [facility], [place]

## 2019-10-15 NOTE — Telephone Encounter (Signed)
Copied from [place] [id number]. Topic: General - Other >> Oct 15, 2019  9:01 AM Oneta Rack wrote: Reason for CRM:  Patient returned call to [facility] his physical due to the provider, Patient [facility] to next available physical slot 03/01/2020. Patient placed on wait list but would like to know when PCP returns can he be worked in for a physical.

## 2019-10-22 NOTE — Progress Notes (Signed)
Trena Platt Cummings,acting as a Education administrator for [employer]have documented all relevant documentation on the behalf of [employer],as directed by  [employer] while in the presence of [employer], [place].  Complete physical exam   Patient: Adrian Arnold   DOB: 1971/03/20   49 y.o. Male  MRN: [id number] Visit Date: 10/26/2019  Today's healthcare provider: Vernie Murders, PA   Chief Complaint  Patient presents with  . Annual Exam   Subjective    Adrian Arnold is a 49 y.o. male who presents today for a complete physical exam.  He reports consuming a general and low sodium diet. The patient has a physically strenuous job, but has no regular exercise apart from work.  He generally feels well. He reports sleeping fairly well. He does not have additional problems to discuss today.    No past medical history on file.   Past Surgical History:  Procedure Laterality Date  . BRAIN SURGERY     skull fusion, nothing intracranial  . EYE SURGERY    . FRACTURE SURGERY     Social History   Socioeconomic History  . Marital status: Married    Spouse name: Not on file  . Number of children: 1  . Years of education: Not on file  . Highest education level: Not on file  Occupational History  . Not on file  Tobacco Use  . Smoking status: Former Smoker    Packs/day: 1.00    Years: 20.00    Pack years: 20.00    Types: Cigarettes    Quit date: 2015    Years since quitting: 6.5  . Smokeless tobacco: Current User    Types: Snuff  Vaping Use  . Vaping Use: Never used  Substance and Sexual Activity  . Alcohol use: Yes    Alcohol/week: 2.0 standard drinks    Types: 2 Cans of beer per week  . Drug use: Never  . Sexual activity: Yes    Partners: Female    Birth control/protection: None  Other Topics Concern  . Not on file  Social History Narrative  . Not on file   Social Determinants of Health   Financial Resource Strain:   . Difficulty of Paying Living Expenses:    Food Insecurity:   . Worried About Charity fundraiser in the Last Year:   . Arboriculturist in the Last Year:   Transportation Needs:   . Film/video editor (Medical):   Marland Kitchen Lack of Transportation (Non-Medical):   Physical Activity:   . Days of Exercise per Week:   . Minutes of Exercise per Session:   Stress:   . Feeling of Stress :   Social Connections:   . Frequency of Communication with Friends and Family:   . Frequency of Social Gatherings with Friends and Family:   . Attends Religious Services:   . Active Member of Clubs or Organizations:   . Attends Archivist Meetings:   Marland Kitchen Marital Status:   Intimate Partner Violence:   . Fear of Current or Ex-Partner:   . Emotionally Abused:   Marland Kitchen Physically Abused:   . Sexually Abused:    Family Status  Relation Name Status  . Mother  Alive  . Father  Deceased at age 55       prostate cancer  . Sister  Alive  . Brother  Alive  . Son  Alive  . MGF  Deceased  . PGM  Deceased  . Mat Uncle  (Not Specified)  . Neg Hx  (Not Specified)   Family History  Problem Relation Age of Onset  . Heart attack Mother 70  . Stroke Mother 15  . Prostate cancer Father 70  . Gallbladder disease Sister   . Gallbladder disease Brother   . Heart attack Maternal Grandfather   . Stroke Maternal Grandfather   . Heart attack Paternal Grandmother   . Colon cancer Maternal Uncle   . Breast cancer Neg Hx    No Known Allergies   Patient Care Team: Virginia Crews, MD as PCP - General (Family Medicine)   Medications: Outpatient Medications Prior to Visit  Medication Sig  . fluticasone (FLONASE) 50 MCG/ACT nasal spray Place 2 sprays into both nostrils daily.  . meclizine (ANTIVERT) 25 MG tablet Take 2 tablets (50 mg total) by mouth 3 (three) times daily as needed.   No facility-administered medications prior to visit.    Review of Systems  Constitutional: Negative.   HENT: Positive for congestion.   Eyes: Negative.    Respiratory: Positive for wheezing.   Cardiovascular: Negative.   Gastrointestinal: Negative.   Endocrine: Negative.   Genitourinary: Negative.   Musculoskeletal: Positive for arthralgias.  Skin: Negative.   Allergic/Immunologic: Negative.   Neurological: Positive for dizziness and light-headedness.  Hematological: Negative.   Psychiatric/Behavioral: Negative.     Objective    BP 120/86 (BP Location: Right Arm, Patient Position: Sitting, Cuff Size: Large)   Pulse 68   Temp (!) 97.1 F (36.2 C) (Temporal)   Ht 5\' 10"  (1.778 m)   Wt 225 lb 6.4 oz (102.2 kg)   BMI 32.34 kg/m  BP Readings from Last 3 Encounters:  10/26/19 120/86  09/09/19 108/73   Wt Readings from Last 3 Encounters:  10/26/19 225 lb 6.4 oz (102.2 kg)  09/09/19 222 lb 3.2 oz (100.8 kg)   Physical Exam Constitutional:      Appearance: He is well-developed.  HENT:     Head: Normocephalic and atraumatic.     Right Ear: External ear normal.     Left Ear: External ear normal.     Nose: Nose normal.  Eyes:     General:        Right eye: No discharge.     Conjunctiva/sclera: Conjunctivae normal.     Pupils: Pupils are equal, round, and reactive to light.  Neck:     Thyroid: No thyromegaly.     Trachea: No tracheal deviation.  Cardiovascular:     Rate and Rhythm: Normal rate and regular rhythm.     Heart sounds: Normal heart sounds. No murmur heard.   Pulmonary:     Effort: Pulmonary effort is normal. No respiratory distress.     Breath sounds: Normal breath sounds. No wheezing or rales.  Chest:     Chest wall: No tenderness.  Abdominal:     General: There is no distension.     Palpations: Abdomen is soft. There is no mass.     Tenderness: There is no abdominal tenderness. There is no guarding or rebound.  Musculoskeletal:        General: No tenderness. Normal range of motion.     Cervical back: Normal range of motion and neck supple.  Lymphadenopathy:     Cervical: No cervical adenopathy.  Skin:     General: Skin is warm and dry.     Findings: No erythema or rash.  Neurological:     Mental Status: He  is alert and oriented to person, place, and time.     Cranial Nerves: No cranial nerve deficit.     Motor: No abnormal muscle tone.     Coordination: Coordination normal.     Deep Tendon Reflexes: Reflexes are normal and symmetric. Reflexes normal.  Psychiatric:        Behavior: Behavior normal.        Thought Content: Thought content normal.        Judgment: Judgment normal.      Last depression screening scores PHQ 2/9 Scores 09/09/2019  PHQ - 2 Score 1  PHQ- 9 Score 3   Last fall risk screening Fall Risk  09/09/2019  Falls in the past year? 0  Number falls in past yr: 0  Injury with Fall? 0  Risk for fall due to : No Fall Risks  Follow up Falls evaluation completed   Last Audit-C alcohol use screening Alcohol Use Disorder Test (AUDIT) 09/09/2019  1. How often do you have a drink containing alcohol? 2  2. How many drinks containing alcohol do you have on a typical day when you are drinking? 1  3. How often do you have six or more drinks on one occasion? 1  AUDIT-C Score 4   A score of 3 or more in women, and 4 or more in men indicates increased risk for alcohol abuse, EXCEPT if all of the points are from question 1   No results found for any visits on 10/26/19.  Assessment & Plan    Routine Health Maintenance and Physical Exam  Exercise Activities and Dietary recommendations Goals   None      There is no immunization history on file for this patient.  Health Maintenance  Topic Date Due  . Hepatitis C Screening  Never done  . COVID-19 Vaccine (1) Never done  . HIV Screening  Never done  . TETANUS/TDAP  Never done  . COLONOSCOPY  Never done  . INFLUENZA VACCINE  11/14/2019    Discussed health benefits of physical activity, and encouraged him to engage in regular exercise appropriate for his age and condition.  1. Annual physical exam Good general  health. Given anticipatory counseling and scheduled for immunizations and colonoscopy. Check routine labs. - CBC with Differential/Platelet - Comprehensive metabolic panel - Lipid panel - TSH  2. Benign paroxysmal positional vertigo, unspecified laterality Occasional dizziness spells. Some help with Meclizine. Will recheck CBC and CMP. No ataxia today. Slight congestion but no fever or other COVID symptoms. - CBC with Differential/Platelet - Comprehensive metabolic panel  3. Screening for colon cancer - Ambulatory referral to Gastroenterology  4. Screening for prostate cancer - PSA  5. Need for hepatitis C screening test - Hepatitis C antibody  6. Screening for HIV (human immunodeficiency virus) - HIV Antibody (routine testing w rflx)   No follow-ups on file.     Andres Shad, PA, have reviewed all documentation for this visit. The documentation on 02/15/20 for the exam, diagnosis, procedures, and orders are all accurate and complete.    Vernie Murders, [place] [phone] (phone) [phone] (fax)  [place]

## 2019-10-26 ENCOUNTER — Encounter: Payer: Self-pay | Admitting: Family Medicine

## 2019-10-26 ENCOUNTER — Other Ambulatory Visit: Payer: Self-pay

## 2019-10-26 ENCOUNTER — Ambulatory Visit: Payer: BC Managed Care – PPO | Admitting: Family Medicine

## 2019-10-26 VITALS — BP 120/86 | HR 68 | Temp 97.1°F | Ht 70.0 in | Wt 225.4 lb

## 2019-10-26 DIAGNOSIS — H8110 Benign paroxysmal vertigo, unspecified ear: Secondary | ICD-10-CM | POA: Diagnosis not present

## 2019-10-26 DIAGNOSIS — E7800 Pure hypercholesterolemia, unspecified: Secondary | ICD-10-CM | POA: Diagnosis not present

## 2019-10-26 DIAGNOSIS — Z125 Encounter for screening for malignant neoplasm of prostate: Secondary | ICD-10-CM

## 2019-10-26 DIAGNOSIS — Z1159 Encounter for screening for other viral diseases: Secondary | ICD-10-CM

## 2019-10-26 DIAGNOSIS — Z1211 Encounter for screening for malignant neoplasm of colon: Secondary | ICD-10-CM

## 2019-10-26 DIAGNOSIS — Z0000 Encounter for general adult medical examination without abnormal findings: Secondary | ICD-10-CM | POA: Diagnosis not present

## 2019-10-26 DIAGNOSIS — Z114 Encounter for screening for human immunodeficiency virus [HIV]: Secondary | ICD-10-CM

## 2019-10-26 NOTE — Patient Instructions (Signed)

## 2019-10-27 LAB — COMPREHENSIVE METABOLIC PANEL
ALT: 17 IU/L (ref 0–44)
AST: 13 IU/L (ref 0–40)
Albumin/Globulin Ratio: 1.7 (ref 1.2–2.2)
Albumin: 4.5 g/dL (ref 3.8–4.9)
Alkaline Phosphatase: 50 IU/L (ref 48–121)
BUN/Creatinine Ratio: 17 (ref 9–20)
BUN: 14 mg/dL (ref 6–24)
Bilirubin Total: 0.6 mg/dL (ref 0.0–1.2)
CO2: 20 mmol/L (ref 20–29)
Calcium: 9.3 mg/dL (ref 8.7–10.2)
Chloride: 103 mmol/L (ref 96–106)
Creatinine, Ser: 0.81 mg/dL (ref 0.76–1.27)
GFR calc Af Amer: 119 mL/min/{1.73_m2} (ref >59)
GFR calc non Af Amer: 103 mL/min/{1.73_m2} (ref >59)
Globulin, Total: 2.7 g/dL (ref 1.5–4.5)
Glucose: 100 mg/dL — ABNORMAL HIGH (ref 65–99)
Potassium: 4.5 mmol/L (ref 3.5–5.2)
Sodium: 136 mmol/L (ref 134–144)
Total Protein: 7.2 g/dL (ref 6.0–8.5)

## 2019-10-27 LAB — CBC WITH DIFFERENTIAL/PLATELET
Basophils Absolute: 0.1 10*3/uL (ref 0.0–0.2)
Basos: 1 %
EOS (ABSOLUTE): 0.4 10*3/uL (ref 0.0–0.4)
Eos: 5 %
Hematocrit: 43.9 % (ref 37.5–51.0)
Hemoglobin: 14.7 g/dL (ref 13.0–17.7)
Immature Grans (Abs): 0 10*3/uL (ref 0.0–0.1)
Immature Granulocytes: 1 %
Lymphocytes Absolute: 2.3 10*3/uL (ref 0.7–3.1)
Lymphs: 28 %
MCH: 30.6 pg (ref 26.6–33.0)
MCHC: 33.5 g/dL (ref 31.5–35.7)
MCV: 92 fL (ref 79–97)
Monocytes Absolute: 0.6 10*3/uL (ref 0.1–0.9)
Monocytes: 8 %
Neutrophils Absolute: 4.7 10*3/uL (ref 1.4–7.0)
Neutrophils: 57 %
Platelets: 277 10*3/uL (ref 150–450)
RBC: 4.8 x10E6/uL (ref 4.14–5.80)
RDW: 11.4 % — ABNORMAL LOW (ref 11.6–15.4)
WBC: 8 10*3/uL (ref 3.4–10.8)

## 2019-10-27 LAB — LIPID PANEL
Chol/HDL Ratio: 3.7 ratio (ref 0.0–5.0)
Cholesterol, Total: 202 mg/dL — ABNORMAL HIGH (ref 100–199)
HDL: 55 mg/dL (ref >39)
LDL Chol Calc (NIH): 133 mg/dL — ABNORMAL HIGH (ref 0–99)
Triglycerides: 75 mg/dL (ref 0–149)
VLDL Cholesterol Cal: 14 mg/dL (ref 5–40)

## 2019-10-27 LAB — TSH: TSH: 1.33 u[IU]/mL (ref 0.450–4.500)

## 2019-10-27 LAB — PSA: Prostate Specific Ag, Serum: 0.8 ng/mL (ref 0.0–4.0)

## 2019-10-27 LAB — HIV ANTIBODY (ROUTINE TESTING W REFLEX): HIV Screen 4th Generation wRfx: NONREACTIVE

## 2019-10-27 LAB — HEPATITIS C ANTIBODY: Hep C Virus Ab: <0.1 s/co ratio (ref 0.0–0.9)

## 2019-11-02 ENCOUNTER — Telehealth: Payer: Self-pay

## 2019-11-02 NOTE — Telephone Encounter (Signed)
Patient called, no answer, mailbox is full.  

## 2019-11-02 NOTE — Telephone Encounter (Signed)
-----   Message from Margo Common, [place] sent at 11/02/2019 12:57 PM EDT ----- All blood tests essentially normal except LDL and total cholesterol elevated. Recommend low fat diet, increased water intake, Krill Oil 1000 mg qd and Red Yeast Rice 600 mg qd. Recheck blood levels in 3 months.

## 2019-11-02 NOTE — Telephone Encounter (Signed)
No answer.  If patient calls in, [facility] triage may give results

## 2019-11-03 ENCOUNTER — Encounter: Payer: Self-pay | Admitting: *Deleted

## 2019-11-03 NOTE — Telephone Encounter (Signed)
No answer. Mail box is full unable to leave message.

## 2019-11-04 ENCOUNTER — Encounter: Payer: Self-pay | Admitting: Family Medicine

## 2020-03-01 ENCOUNTER — Encounter: Payer: Self-pay | Admitting: Family Medicine

## 2020-03-23 ENCOUNTER — Ambulatory Visit: Payer: BC Managed Care – PPO | Admitting: Family Medicine

## 2020-03-23 ENCOUNTER — Encounter: Payer: Self-pay | Admitting: Family Medicine

## 2020-03-23 ENCOUNTER — Other Ambulatory Visit: Payer: Self-pay

## 2020-03-23 VITALS — BP 128/89 | HR 88 | Temp 98.9°F | Resp 16 | Ht 71.5 in | Wt 230.0 lb

## 2020-03-23 DIAGNOSIS — R030 Elevated blood-pressure reading, without diagnosis of hypertension: Secondary | ICD-10-CM | POA: Diagnosis not present

## 2020-03-23 DIAGNOSIS — Z87891 Personal history of nicotine dependence: Secondary | ICD-10-CM

## 2020-03-23 DIAGNOSIS — H8110 Benign paroxysmal vertigo, unspecified ear: Secondary | ICD-10-CM | POA: Diagnosis not present

## 2020-03-23 DIAGNOSIS — R062 Wheezing: Secondary | ICD-10-CM | POA: Diagnosis not present

## 2020-03-23 MED ORDER — ALBUTEROL SULFATE HFA 108 (90 BASE) MCG/ACT IN AERS: 2.0000 | INHALATION_SPRAY | Freq: Four times a day (QID) | RESPIRATORY_TRACT | 2 refills | Status: DC | PRN

## 2020-03-23 NOTE — Patient Instructions (Signed)

## 2020-03-23 NOTE — Progress Notes (Signed)
Established patient visit   Patient: Adrian Arnold   DOB: October 02, 1970   49 y.o. Male  MRN: [id number] Visit Date: 03/23/2020  Today's healthcare provider: Lavon Paganini, MD   Chief Complaint  Patient presents with   Dizziness   Subjective    Dizziness This is a recurrent problem. The current episode started yesterday. The problem has been waxing and waning. Pertinent negatives include no congestion, coughing, fatigue or sore throat.    Patient reports that he had a bad episode last night. He has used Flonase daily for the last 6 mos, but reports that it doesn't help much. He also mentions that he takes Claritin, but he hasn't noticed much improvement with that either.    Social History   Tobacco Use   Smoking status: Former Smoker    Packs/day: 1.00    Years: 20.00    Pack years: 20.00    Types: Cigarettes    Quit date: 2015    Years since quitting: 6.9   Smokeless tobacco: Current User    Types: Snuff  Vaping Use   Vaping Use: Never used  Substance Use Topics   Alcohol use: Yes    Alcohol/week: 2.0 standard drinks    Types: 2 Cans of beer per week   Drug use: Never       Medications: Outpatient Medications Prior to Visit  Medication Sig   fluticasone (FLONASE) 50 MCG/ACT nasal spray Place 2 sprays into both nostrils daily.   meclizine (ANTIVERT) 25 MG tablet Take 2 tablets (50 mg total) by mouth 3 (three) times daily as needed.   No facility-administered medications prior to visit.    Review of Systems  Constitutional: Negative for activity change and fatigue.  HENT: Negative for congestion, ear pain, postnasal drip, sinus pain and sore throat.   Respiratory: Negative for cough and shortness of breath.   Neurological: Positive for dizziness. Negative for light-headedness.      Objective    BP 128/89    Pulse 88    Temp 98.9 F (37.2 C)    Resp 16    Ht 5' 11.5" (1.816 m)    Wt 230 lb (104.3 kg)    BMI 31.63 kg/m    Physical Exam Vitals  reviewed.  Constitutional:      General: He is not in acute distress.    Appearance: Normal appearance. He is not diaphoretic.  HENT:     Head: Normocephalic and atraumatic.  Eyes:     General: No scleral icterus.    Conjunctiva/sclera: Conjunctivae normal.     Comments: No nystagmus.  Cardiovascular:     Rate and Rhythm: Normal rate and regular rhythm.     Pulses: Normal pulses.     Heart sounds: Normal heart sounds. No murmur heard.   Pulmonary:     Effort: Pulmonary effort is normal. No respiratory distress.     Breath sounds: Normal breath sounds. No wheezing or rhonchi.  Abdominal:     General: There is no distension.     Palpations: Abdomen is soft.     Tenderness: There is no abdominal tenderness.  Musculoskeletal:     Cervical back: Neck supple.     Right lower leg: No edema.     Left lower leg: No edema.  Lymphadenopathy:     Cervical: No cervical adenopathy.  Skin:    General: Skin is warm and dry.     Capillary Refill: Capillary refill takes less than 2 seconds.  Findings: No rash.  Neurological:     General: No focal deficit present.     Mental Status: He is alert and oriented to person, place, and time. Mental status is at baseline.     Cranial Nerves: No cranial nerve deficit.     Coordination: Coordination normal.     Gait: Gait normal.  Psychiatric:        Mood and Affect: Mood normal.        Behavior: Behavior normal.       No results found for any visits on 03/23/20.  Assessment & Plan     Problem List Items Addressed This Visit      Nervous and Auditory   Benign paroxysmal positional vertigo - Primary    History of turning his head in a certain direction and looking up and triggering an episode of vertigo is consistent with BPPV as diagnosed previously He is neuro intact with no ataxia or decreased coordination Continue meclizine as needed Continue Flonase and Claritin for sinus congestion related to allergic rhinitis Discussed  importance of home Epley maneuver Offered referral to vestibular PT, but patient declines due to demands of work at this time        Other   Wheezing    New problem over the last several months Seems worse at night when he is laying down to sleep, but can come on with exertion as well He does have a 20-pack-year history of smoking, so he may have some COPD Sent for PFTs Trial of albuterol as needed Lung cancer screening with low-dose CT scan given his smoking history as well      Relevant Orders   Pulmonary function test   Elevated BP without diagnosis of hypertension    Initially elevated, but improved prior to leaving Does seem to have some degree of whitecoat hypertension Discussed low-sodium diet Can monitor home blood pressures       Other Visit Diagnoses    Personal history of tobacco use, presenting hazards to health       Relevant Orders   Pulmonary function test   CT CHEST LUNG CA SCREEN LOW DOSE W/O CM       Return in about 7 months (around 10/21/2020) for CPE - last July 13.      I, Lavon Paganini, MD, have reviewed all documentation for this visit. The documentation on 03/24/20 for the exam, diagnosis, procedures, and orders are all accurate and complete.   Tameem Pullara, Dionne Bucy, MD, MPH [facility]

## 2020-03-24 NOTE — Assessment & Plan Note (Signed)
New problem over the last several months Seems worse at night when he is laying down to sleep, but can come on with exertion as well He does have a 20-pack-year history of smoking, so he may have some COPD Sent for PFTs Trial of albuterol as needed Lung cancer screening with low-dose CT scan given his smoking history as well

## 2020-03-24 NOTE — Assessment & Plan Note (Signed)
Initially elevated, but improved prior to leaving Does seem to have some degree of whitecoat hypertension Discussed low-sodium diet Can monitor home blood pressures

## 2020-03-24 NOTE — Assessment & Plan Note (Signed)
History of turning his head in a certain direction and looking up and triggering an episode of vertigo is consistent with BPPV as diagnosed previously He is neuro intact with no ataxia or decreased coordination Continue meclizine as needed Continue Flonase and Claritin for sinus congestion related to allergic rhinitis Discussed importance of home Epley maneuver Offered referral to vestibular PT, but patient declines due to demands of work at this time

## 2020-03-31 ENCOUNTER — Telehealth: Payer: Self-pay | Admitting: *Deleted

## 2020-03-31 NOTE — Telephone Encounter (Signed)
Received referral for low dose lung cancer screening CT scan. Message left at phone number listed in EMR for patient to call me back to facilitate scheduling scan.  

## 2020-04-03 DIAGNOSIS — Z20822 Contact with and (suspected) exposure to covid-19: Secondary | ICD-10-CM | POA: Diagnosis not present

## 2020-04-04 ENCOUNTER — Encounter: Payer: Self-pay | Admitting: *Deleted

## 2020-04-04 ENCOUNTER — Telehealth: Payer: Self-pay | Admitting: *Deleted

## 2020-04-04 NOTE — Telephone Encounter (Signed)
Received referral for low dose lung cancer screening CT scan. Message left at phone number listed in EMR for patient to call me back to facilitate scheduling scan.  

## 2020-04-17 ENCOUNTER — Other Ambulatory Visit: Payer: Self-pay | Admitting: Family Medicine

## 2020-04-17 DIAGNOSIS — Z87891 Personal history of nicotine dependence: Secondary | ICD-10-CM

## 2020-04-17 DIAGNOSIS — R062 Wheezing: Secondary | ICD-10-CM

## 2020-04-20 ENCOUNTER — Other Ambulatory Visit: Admission: RE | Admit: 2020-04-20 | Payer: BC Managed Care – PPO | Source: Ambulatory Visit

## 2020-04-21 ENCOUNTER — Ambulatory Visit: Payer: BC Managed Care – PPO

## 2020-07-24 ENCOUNTER — Telehealth: Payer: Self-pay | Admitting: *Deleted

## 2020-07-24 NOTE — Telephone Encounter (Signed)
Tried to reach patient again, regarding the referral we received for lung screening CT Scan. Message left at phone number listed in EMR for patient to call me back to facilitate scheduling scan.

## 2020-07-28 ENCOUNTER — Encounter: Payer: Self-pay | Admitting: *Deleted

## 2020-10-26 ENCOUNTER — Encounter: Payer: Self-pay | Admitting: Family Medicine

## 2020-10-26 ENCOUNTER — Other Ambulatory Visit: Payer: Self-pay

## 2020-10-26 ENCOUNTER — Ambulatory Visit (INDEPENDENT_AMBULATORY_CARE_PROVIDER_SITE_OTHER): Payer: BC Managed Care – PPO | Admitting: Family Medicine

## 2020-10-26 VITALS — BP 117/85 | HR 70 | Temp 98.3°F | Resp 16 | Ht 71.5 in | Wt 227.0 lb

## 2020-10-26 DIAGNOSIS — Z720 Tobacco use: Secondary | ICD-10-CM

## 2020-10-26 DIAGNOSIS — Z1211 Encounter for screening for malignant neoplasm of colon: Secondary | ICD-10-CM

## 2020-10-26 DIAGNOSIS — Z23 Encounter for immunization: Secondary | ICD-10-CM

## 2020-10-26 DIAGNOSIS — E669 Obesity, unspecified: Secondary | ICD-10-CM

## 2020-10-26 DIAGNOSIS — Z0000 Encounter for general adult medical examination without abnormal findings: Secondary | ICD-10-CM | POA: Diagnosis not present

## 2020-10-26 DIAGNOSIS — Z6831 Body mass index (BMI) 31.0-31.9, adult: Secondary | ICD-10-CM

## 2020-10-26 DIAGNOSIS — E663 Overweight: Secondary | ICD-10-CM | POA: Insufficient documentation

## 2020-10-26 DIAGNOSIS — Z87891 Personal history of nicotine dependence: Secondary | ICD-10-CM

## 2020-10-26 NOTE — Progress Notes (Signed)
Complete physical exam   Patient: Adrian Arnold   DOB: August 08, 1970   50 y.o. Male  MRN: [id number] Visit Date: 10/26/2020  Today's healthcare provider: Lavon Paganini, MD   Chief Complaint  Patient presents with   Annual Exam   Subjective    Adrian Arnold is a 50 y.o. male who presents today for a complete physical exam.  He reports consuming a general diet. The patient does not participate in regular exercise at present. He generally feels well. He reports sleeping well. He does not have additional problems to discuss today.   HPI  Wheezing He uses his albuterol intermittently. His symptoms of wheezing have subsided.   Vertigo He hasn't had any recent episodes of veritgo. At times when he moves too fast he feels a little "twinge" however no syncopal episodes, weakness, or dizziness.   Ruptured Blisters He reports last week his blisters on his right shin from sun burn ruptured. He doesn't have complaints of pain, leakage or excessive bleeding or irritation.   Social Hx He no longer smokes however he does chew tobacco. He traveled to [place] and he only really put it in during the drive to keep him alert.   Screenings He received a call from the pulmonologist office however he is extremely busy at work so he hasn't been able to schedule an appointment. He denies receiving a colonoscopy, he believes that his insurance should cover both procedures.   History reviewed. No pertinent past medical history. Past Surgical History:  Procedure Laterality Date   BRAIN SURGERY     skull fusion, nothing intracranial   EYE SURGERY     FRACTURE SURGERY     Social History   Socioeconomic History   Marital status: Married    Spouse name: Not on file   Number of children: 1   Years of education: Not on file   Highest education level: Not on file  Occupational History   Not on file  Tobacco Use   Smoking status: Former    Packs/day: 1.00    Years: 20.00    Pack years: 20.00     Types: Cigarettes    Quit date: 2015    Years since quitting: 7.5   Smokeless tobacco: Current    Types: Snuff  Vaping Use   Vaping Use: Never used  Substance and Sexual Activity   Alcohol use: Yes    Alcohol/week: 2.0 standard drinks    Types: 2 Cans of beer per week   Drug use: Never   Sexual activity: Yes    Partners: Female    Birth control/protection: None  Other Topics Concern   Not on file  Social History Narrative   Not on file   Social Determinants of Health   Financial Resource Strain: Not on file  Food Insecurity: Not on file  Transportation Needs: Not on file  Physical Activity: Not on file  Stress: Not on file  Social Connections: Not on file  Intimate Partner Violence: Not on file   Family Status  Relation Name Status   Mother  Alive   Father  Deceased at age 81       prostate cancer   Sister  Alive   Brother  Alive   Son  Alive   MGF  Deceased   [place]  Deceased   Mat Uncle  (Not Specified)   Neg Hx  (Not Specified)   Family History  Problem Relation Age of Onset   Heart  attack Mother 64   Stroke Mother 66   Prostate cancer Father 63   Gallbladder disease Sister    Gallbladder disease Brother    Heart attack Maternal Grandfather    Stroke Maternal Grandfather    Heart attack Paternal Grandmother    Colon cancer Maternal Uncle    Breast cancer Neg Hx    No Known Allergies  Patient Care Team: Virginia Crews, MD as PCP - General (Family Medicine)   Medications: Outpatient Medications Prior to Visit  Medication Sig   albuterol (VENTOLIN HFA) 108 (90 Base) MCG/ACT inhaler Inhale 2 puffs into the lungs every 6 (six) hours as needed for wheezing or shortness of breath.   [DISCONTINUED] fluticasone (FLONASE) 50 MCG/ACT nasal spray Place 2 sprays into both nostrils daily. (Patient not taking: Reported on 10/26/2020)   [DISCONTINUED] meclizine (ANTIVERT) 25 MG tablet Take 2 tablets (50 mg total) by mouth 3 (three) times daily as needed. (Patient  not taking: Reported on 10/26/2020)   No facility-administered medications prior to visit.    Review of Systems  Constitutional:  Negative for chills, diaphoresis, fatigue and fever.  HENT:  Negative for ear pain, sinus pressure, sinus pain and sore throat.   Eyes:  Negative for pain and visual disturbance.  Respiratory:  Negative for cough, chest tightness, shortness of breath and wheezing.   Cardiovascular:  Negative for chest pain, palpitations and leg swelling.  Gastrointestinal:  Negative for abdominal pain, blood in stool, constipation, diarrhea, nausea and vomiting.  Genitourinary:  Negative for flank pain, frequency and urgency.  Musculoskeletal:  Negative for back pain, myalgias and neck pain.  Skin:  Positive for wound.  Neurological:  Negative for dizziness, seizures, syncope, weakness, light-headedness, numbness and headaches.  All other systems reviewed and are negative.  Last CBC Lab Results  Component Value Date   WBC 8.0 10/26/2019   HGB 14.7 10/26/2019   HCT 43.9 10/26/2019   MCV 92 10/26/2019   MCH 30.6 10/26/2019   RDW 11.4 (L) 10/26/2019   PLT 277 43/15/4008   Last metabolic panel Lab Results  Component Value Date   GLUCOSE 100 (H) 10/26/2019   NA 136 10/26/2019   K 4.5 10/26/2019   CL 103 10/26/2019   CO2 20 10/26/2019   BUN 14 10/26/2019   CREATININE 0.81 10/26/2019   GFRNONAA 103 10/26/2019   GFRAA 119 10/26/2019   CALCIUM 9.3 10/26/2019   PROT 7.2 10/26/2019   ALBUMIN 4.5 10/26/2019   LABGLOB 2.7 10/26/2019   AGRATIO 1.7 10/26/2019   BILITOT 0.6 10/26/2019   ALKPHOS 50 10/26/2019   AST 13 10/26/2019   ALT 17 10/26/2019   Last lipids Lab Results  Component Value Date   CHOL 202 (H) 10/26/2019   HDL 55 10/26/2019   LDLCALC 133 (H) 10/26/2019   TRIG 75 10/26/2019   CHOLHDL 3.7 10/26/2019    Last thyroid functions Lab Results  Component Value Date   TSH 1.330 10/26/2019       Objective    BP 117/85 (BP Location: Left Arm,  Patient Position: Sitting, Cuff Size: Large)   Pulse 70   Temp 98.3 F (36.8 C) (Oral)   Resp 16   Ht 5' 11.5" (1.816 m)   Wt 227 lb (103 kg)   SpO2 99%   BMI 31.22 kg/m  BP Readings from Last 3 Encounters:  10/26/20 117/85  03/23/20 128/89  10/26/19 120/86   Wt Readings from Last 3 Encounters:  10/26/20 227 lb (103 kg)  03/23/20  230 lb (104.3 kg)  10/26/19 225 lb 6.4 oz (102.2 kg)      Physical Exam Vitals reviewed.  Constitutional:      General: He is not in acute distress.    Appearance: Normal appearance. He is well-developed. He is not diaphoretic.  HENT:     Head: Normocephalic and atraumatic.     Right Ear: Tympanic membrane, ear canal and external ear normal.     Left Ear: Tympanic membrane, ear canal and external ear normal.     Nose: Nose normal.     Mouth/Throat:     Mouth: Mucous membranes are moist.     Pharynx: Oropharynx is clear. No oropharyngeal exudate.  Eyes:     General: No scleral icterus.    Conjunctiva/sclera: Conjunctivae normal.     Pupils: Pupils are equal, round, and reactive to light.  Neck:     Thyroid: No thyromegaly.  Cardiovascular:     Rate and Rhythm: Normal rate and regular rhythm.     Pulses: Normal pulses.     Heart sounds: Normal heart sounds. No murmur heard. Pulmonary:     Effort: Pulmonary effort is normal. No respiratory distress.     Breath sounds: Normal breath sounds. No wheezing or rales.  Abdominal:     General: There is no distension.     Palpations: Abdomen is soft.     Tenderness: There is no abdominal tenderness.  Musculoskeletal:        General: No deformity.     Cervical back: Neck supple.     Right lower leg: No edema.     Left lower leg: No edema.  Lymphadenopathy:     Cervical: No cervical adenopathy.  Skin:    General: Skin is warm and dry.     Findings: No rash.       Neurological:     Mental Status: He is alert and oriented to person, place, and time. Mental status is at baseline.     Sensory:  No sensory deficit.     Motor: No weakness.     Gait: Gait normal.  Psychiatric:        Mood and Affect: Mood normal.        Behavior: Behavior normal.        Thought Content: Thought content normal.    Last depression screening scores PHQ 2/9 Scores 10/26/2020 03/23/2020 10/26/2019  PHQ - 2 Score 1 1 1   PHQ- 9 Score 3 2 4    Last fall risk screening Fall Risk  10/26/2020  Falls in the past year? 0  Number falls in past yr: 0  Injury with Fall? 0  Risk for fall due to : No Fall Risks  Follow up Falls evaluation completed   Last Audit-C alcohol use screening Alcohol Use Disorder Test (AUDIT) 10/26/2020  1. How often do you have a drink containing alcohol? 2  2. How many drinks containing alcohol do you have on a typical day when you are drinking? 1  3. How often do you have six or more drinks on one occasion? 1  AUDIT-C Score 4  4. How often during the last year have you found that you were not able to stop drinking once you had started? -  5. How often during the last year have you failed to do what was normally expected from you because of drinking? -  6. How often during the last year have you needed a first drink in the morning to get yourself  going after a heavy drinking session? -  7. How often during the last year have you had a feeling of guilt of remorse after drinking? -  8. How often during the last year have you been unable to remember what happened the night before because you had been drinking? -  9. Have you or someone else been injured as a result of your drinking? -  10. Has a relative or friend or a doctor or another health worker been concerned about your drinking or suggested you cut down? -  Alcohol Use Disorder Identification Test Final Score (AUDIT) -  Alcohol Brief Interventions/Follow-up -   A score of 3 or more in women, and 4 or more in men indicates increased risk for alcohol abuse, EXCEPT if all of the points are from question 1   No results found for any  visits on 10/26/20.  Assessment & Plan     Problem List Items Addressed This Visit       Other   Chewing tobacco use    counseled about risk Encourage cessation       History of tobacco use    Has been referred for lung cancer screening Encouraged him to call       Relevant Orders   Ambulatory Referral for Lung Cancer Scre   Class 1 obesity without serious comorbidity with body mass index (BMI) of 31.0 to 31.9 in adult    Discussed importance of healthy weight management Discussed diet and exercise        Other Visit Diagnoses     Annual physical exam    -  Primary   Relevant Orders   Comprehensive metabolic panel   Lipid Panel With LDL/HDL Ratio   Need for Tdap vaccination       Relevant Orders   Tdap vaccine greater than or equal to 7yo IM   Colon cancer screening       Relevant Orders   Ambulatory referral to Gastroenterology      Routine Health Maintenance and Physical Exam  Exercise Activities and Dietary recommendations  Goals   None     There is no immunization history for the selected administration types on file for this patient.  Health Maintenance  Topic Date Due   COVID-19 Vaccine (1) Never done   TETANUS/TDAP  Never done   COLONOSCOPY (Pts 45-39yrs Insurance coverage will need to be confirmed)  Never done   Zoster Vaccines- Shingrix (1 of 2) Never done   INFLUENZA VACCINE  11/13/2020   Hepatitis C Screening  Completed   HIV Screening  Completed   Pneumococcal Vaccine 68-90 Years old  Aged Out   HPV VACCINES  Aged Out    Discussed health benefits of physical activity, and encouraged him to engage in regular exercise appropriate for his age and condition.  Return in about 1 year (around 10/26/2021) for CPE.     I,Essence Turner,acting as a Education administrator for Lavon Paganini, MD.,have documented all relevant documentation on the behalf of Lavon Paganini, MD,as directed by  Lavon Paganini, MD while in the presence of Lavon Paganini,  MD.  I, Lavon Paganini, MD, have reviewed all documentation for this visit. The documentation on 10/26/20 for the exam, diagnosis, procedures, and orders are all accurate and complete.   Spyridon Hornstein, Dionne Bucy, MD, MPH [facility]

## 2020-10-26 NOTE — Patient Instructions (Addendum)
Preventive Care 40-52 Years Old, Male Preventive care refers to lifestyle choices and visits with your health care provider that can promote health and wellness. This includes: A yearly physical exam. This is also called an annual wellness visit. Regular dental and eye exams. Immunizations. Screening for certain conditions. Healthy lifestyle choices, such as: Eating a healthy diet. Getting regular exercise. Not using drugs or products that contain nicotine and tobacco. Limiting alcohol use. What can I expect for my preventive care visit? Physical exam Your health care provider will check your: Height and weight. These may be used to calculate your BMI (body mass index). BMI is a measurement that tells if you are at a healthy weight. Heart rate and blood pressure. Body temperature. Skin for abnormal spots. Counseling Your health care provider may ask you questions about your: Past medical problems. Family's medical history. Alcohol, tobacco, and drug use. Emotional well-being. Home life and relationship well-being. Sexual activity. Diet, exercise, and sleep habits. Work and work environment. Access to firearms. What immunizations do I need?  Vaccines are usually given at various ages, according to a schedule. Your health care provider will recommend vaccines for you based on your age, medicalhistory, and lifestyle or other factors, such as travel or where you work. What tests do I need? Blood tests Lipid and cholesterol levels. These may be checked every 5 years, or more often if you are over 52 years old. Hepatitis C test. Hepatitis B test. Screening Lung cancer screening. You may have this screening every year starting at age 52 if you have a 30-pack-year history of smoking and currently smoke or have quit within the past 15 years. Prostate cancer screening. Recommendations will vary depending on your family history and other risks. Genital exam to check for testicular cancer  or hernias. Colorectal cancer screening. All adults should have this screening starting at age 52 and continuing until age 75. Your health care provider may recommend screening at age 52 if you are at increased risk. You will have tests every 1-10 years, depending on your results and the type of screening test. Diabetes screening. This is done by checking your blood sugar (glucose) after you have not eaten for a while (fasting). You may have this done every 1-3 years. STD (sexually transmitted disease) testing, if you are at risk. Follow these instructions at home: Eating and drinking  Eat a diet that includes fresh fruits and vegetables, whole grains, lean protein, and low-fat dairy products. Take vitamin and mineral supplements as recommended by your health care provider. Do not drink alcohol if your health care provider tells you not to drink. If you drink alcohol: Limit how much you have to 0-2 drinks a day. Be aware of how much alcohol is in your drink. In the [place] one drink equals one 12 oz bottle of beer (355 mL), one 5 oz glass of wine (148 mL), or one 1 oz glass of hard liquor (44 mL).  Lifestyle Take daily care of your teeth and gums. Brush your teeth every morning and night with fluoride toothpaste. Floss one time each day. Stay active. Exercise for at least 30 minutes 5 or more days each week. Do not use any products that contain nicotine or tobacco, such as cigarettes, e-cigarettes, and chewing tobacco. If you need help quitting, ask your health care provider. Do not use drugs. If you are sexually active, practice safe sex. Use a condom or other form of protection to prevent STIs (sexually transmitted infections). If told by   your health care provider, take low-dose aspirin daily starting at age 10. Find healthy ways to cope with stress, such as: Meditation, yoga, or listening to music. Journaling. Talking to a trusted person. Spending time with friends and  family. Safety Always wear your seat belt while driving or riding in a vehicle. Do not drive: If you have been drinking alcohol. Do not ride with someone who has been drinking. When you are tired or distracted. While texting. Wear a helmet and other protective equipment during sports activities. If you have firearms in your house, make sure you follow all gun safety procedures. What's next? Go to your health care provider once a year for an annual wellness visit. Ask your health care provider how often you should have your eyes and teeth checked. Stay up to date on all vaccines. This information is not intended to replace advice given to you by your health care provider. Make sure you discuss any questions you have with your healthcare provider. Document Revised: 12/29/2018 Document Reviewed: 03/26/2018 Elsevier Patient Education  2022 [place] recommends two doses of Shingrix (the shingles vaccine) separated by 2 to 6 months for adults age 52 years and older. I recommend checking with your insurance plan regarding coverage for this vaccine.

## 2020-10-26 NOTE — Assessment & Plan Note (Signed)
counseled about risk Encourage cessation

## 2020-10-26 NOTE — Assessment & Plan Note (Signed)
Discussed importance of healthy weight management Discussed diet and exercise  

## 2020-10-26 NOTE — Assessment & Plan Note (Signed)
Has been referred for lung cancer screening Encouraged him to call

## 2020-10-27 LAB — COMPREHENSIVE METABOLIC PANEL
ALT: 35 IU/L (ref 0–44)
AST: 20 IU/L (ref 0–40)
Albumin/Globulin Ratio: 2.3 — ABNORMAL HIGH (ref 1.2–2.2)
Albumin: 4.8 g/dL (ref 3.8–4.9)
Alkaline Phosphatase: 43 IU/L — ABNORMAL LOW (ref 44–121)
BUN/Creatinine Ratio: 15 (ref 9–20)
BUN: 13 mg/dL (ref 6–24)
Bilirubin Total: 0.6 mg/dL (ref 0.0–1.2)
CO2: 22 mmol/L (ref 20–29)
Calcium: 9 mg/dL (ref 8.7–10.2)
Chloride: 102 mmol/L (ref 96–106)
Creatinine, Ser: 0.85 mg/dL (ref 0.76–1.27)
Globulin, Total: 2.1 g/dL (ref 1.5–4.5)
Glucose: 100 mg/dL — ABNORMAL HIGH (ref 65–99)
Potassium: 5 mmol/L (ref 3.5–5.2)
Sodium: 138 mmol/L (ref 134–144)
Total Protein: 6.9 g/dL (ref 6.0–8.5)
eGFR: 105 mL/min/{1.73_m2} (ref >59)

## 2020-10-27 LAB — LIPID PANEL WITH LDL/HDL RATIO
Cholesterol, Total: 178 mg/dL (ref 100–199)
HDL: 56 mg/dL (ref >39)
LDL Chol Calc (NIH): 103 mg/dL — ABNORMAL HIGH (ref 0–99)
LDL/HDL Ratio: 1.8 ratio (ref 0.0–3.6)
Triglycerides: 103 mg/dL (ref 0–149)
VLDL Cholesterol Cal: 19 mg/dL (ref 5–40)

## 2020-11-02 ENCOUNTER — Encounter: Payer: Self-pay | Admitting: *Deleted

## 2020-12-22 ENCOUNTER — Other Ambulatory Visit: Payer: Self-pay | Admitting: Family Medicine

## 2021-04-12 ENCOUNTER — Other Ambulatory Visit: Payer: Self-pay | Admitting: Family Medicine

## 2021-04-12 NOTE — Telephone Encounter (Signed)
Requested Prescriptions  Pending Prescriptions Disp Refills   albuterol (VENTOLIN HFA) 108 (90 Base) MCG/ACT inhaler [Pharmacy Med Name: ALBUTEROL HFA (PROVENTIL) INH] 6.7 each 2    Sig: INHALE 2 PUFFS INTO LUNGS EVERY 6 HOURS AS NEEDED FOR WHEEZE OR SHORTNESS OF BREATH     Pulmonology:  Beta [facility]onists Failed - 04/12/2021  1:39 AM      Failed - One inhaler should last at least one month. If the patient is requesting refills earlier, contact the patient to check for uncontrolled symptoms.      Passed - Valid encounter within last 12 months    Recent Outpatient Visits          5 months ago Annual physical exam   [facility] [place], Dionne Bucy, MD   1 year ago Benign paroxysmal positional vertigo, unspecified laterality   [facility] [place], Dionne Bucy, MD   1 year ago Annual physical exam   [place], PA-C   1 year ago Benign paroxysmal positional vertigo, unspecified laterality   [facility] [place], Dionne Bucy, MD      Future Appointments            In 6 months Bac[place]al[place], Dionne Bucy, MD [facility] [place]

## 2021-10-29 ENCOUNTER — Encounter: Payer: Self-pay | Admitting: Family Medicine

## 2022-01-25 NOTE — Progress Notes (Signed)
I,Sulibeya S Dimas,acting as a Neurosurgeon for Shirlee Latch, MD.,have documented all relevant documentation on the behalf of Shirlee Latch, MD,as directed by  Shirlee Latch, MD while in the presence of Shirlee Latch, MD.    Complete physical exam   Patient: Adrian Arnold   DOB: 1970/07/16   51 y.o. Male  MRN: [id number] Visit Date: 01/28/2022  Today's healthcare provider: Shirlee Latch, MD   Chief Complaint  Patient presents with   Annual Exam   Subjective    Adrian Arnold is a 51 y.o. male who presents today for a complete physical exam.  He reports consuming a general diet. The patient does not participate in regular exercise at present. He generally feels well. He reports sleeping well. He does not have additional problems to discuss today.  HPI    History reviewed. No pertinent past medical history. Past Surgical History:  Procedure Laterality Date   BRAIN SURGERY     skull fusion, nothing intracranial   EYE SURGERY     FRACTURE SURGERY     Social History   Socioeconomic History   Marital status: Married    Spouse name: Not on file   Number of children: 1   Years of education: Not on file   Highest education level: Not on file  Occupational History   Not on file  Tobacco Use   Smoking status: Former    Packs/day: 1.00    Years: 20.00    Total pack years: 20.00    Types: Cigarettes    Quit date: 2015    Years since quitting: 8.7   Smokeless tobacco: Current    Types: Snuff  Vaping Use   Vaping Use: Never used  Substance and Sexual Activity   Alcohol use: Yes    Alcohol/week: 2.0 standard drinks of alcohol    Types: 2 Cans of beer per week   Drug use: Never   Sexual activity: Yes    Partners: Female    Birth control/protection: None  Other Topics Concern   Not on file  Social History Narrative   Not on file   Social Determinants of Health   Financial Resource Strain: Not on file  Food Insecurity: Not on file  Transportation  Needs: Not on file  Physical Activity: Not on file  Stress: Not on file  Social Connections: Not on file  Intimate Partner Violence: Not on file   Family Status  Relation Name Status   Mother  Alive   Father  Deceased at age 84       prostate cancer   Sister  Alive   Brother  Alive   Son  Alive   MGF  Deceased   PGM  Deceased   Nurse, mental health  (Not Specified)   Neg Hx  (Not Specified)   Family History  Problem Relation Age of Onset   Heart attack Mother 81   Stroke Mother 85   Prostate cancer Father 42   Gallbladder disease Sister    Gallbladder disease Brother    Heart attack Maternal Grandfather    Stroke Maternal Grandfather    Heart attack Paternal Grandmother    Colon cancer Maternal Uncle    Breast cancer Neg Hx    No Known Allergies  Patient Care Team: Karlye Ihrig, Marzella Schlein, MD as PCP - General (Family Medicine)   Medications: Outpatient Medications Prior to Visit  Medication Sig   albuterol (VENTOLIN HFA) 108 (90 Base) MCG/ACT inhaler INHALE 2 PUFFS INTO LUNGS  EVERY 6 HOURS AS NEEDED FOR WHEEZE OR SHORTNESS OF BREATH   No facility-administered medications prior to visit.    Review of Systems  Respiratory:  Positive for wheezing.   All other systems reviewed and are negative.   Last CBC Lab Results  Component Value Date   WBC 8.0 10/26/2019   HGB 14.7 10/26/2019   HCT 43.9 10/26/2019   MCV 92 10/26/2019   MCH 30.6 10/26/2019   RDW 11.4 (L) 10/26/2019   PLT 277 10/26/2019   Last metabolic panel Lab Results  Component Value Date   GLUCOSE 100 (H) 10/26/2020   NA 138 10/26/2020   K 5.0 10/26/2020   CL 102 10/26/2020   CO2 22 10/26/2020   BUN 13 10/26/2020   CREATININE 0.85 10/26/2020   EGFR 105 10/26/2020   CALCIUM 9.0 10/26/2020   PROT 6.9 10/26/2020   ALBUMIN 4.8 10/26/2020   LABGLOB 2.1 10/26/2020   AGRATIO 2.3 (H) 10/26/2020   BILITOT 0.6 10/26/2020   ALKPHOS 43 (L) 10/26/2020   AST 20 10/26/2020   ALT 35 10/26/2020   Last lipids Lab  Results  Component Value Date   CHOL 178 10/26/2020   HDL 56 10/26/2020   LDLCALC 103 (H) 10/26/2020   TRIG 103 10/26/2020   CHOLHDL 3.7 10/26/2019   Last hemoglobin A1c No results found for: "HGBA1C" Last thyroid functions Lab Results  Component Value Date   TSH 1.330 10/26/2019   Last vitamin D No results found for: "25OHVITD2", "25OHVITD3", "VD25OH" Last vitamin B12 and Folate No results found for: "VITAMINB12", "FOLATE"    Objective    BP 123/86 (BP Location: Left Arm, Patient Position: Bed low/side rails up, Cuff Size: Large)   Pulse 64   Temp 98.3 F (36.8 C) (Oral)   Resp 16   Ht 5' 11.5" (1.816 m)   Wt 241 lb 8 oz (109.5 kg)   SpO2 96%   BMI 33.21 kg/m  BP Readings from Last 3 Encounters:  01/28/22 123/86  10/26/20 117/85  03/23/20 128/89   Wt Readings from Last 3 Encounters:  01/28/22 241 lb 8 oz (109.5 kg)  10/26/20 227 lb (103 kg)  03/23/20 230 lb (104.3 kg)      Physical Exam Vitals reviewed.  Constitutional:      General: He is not in acute distress.    Appearance: Normal appearance. He is well-developed. He is not diaphoretic.  HENT:     Head: Normocephalic and atraumatic.     Right Ear: Tympanic membrane, ear canal and external ear normal.     Left Ear: Tympanic membrane, ear canal and external ear normal.     Nose: Nose normal.     Mouth/Throat:     Mouth: Mucous membranes are moist.     Pharynx: Oropharynx is clear. No oropharyngeal exudate.  Eyes:     General: No scleral icterus.    Conjunctiva/sclera: Conjunctivae normal.     Pupils: Pupils are equal, round, and reactive to light.  Neck:     Thyroid: No thyromegaly.  Cardiovascular:     Rate and Rhythm: Normal rate and regular rhythm.     Pulses: Normal pulses.     Heart sounds: Normal heart sounds. No murmur heard. Pulmonary:     Effort: Pulmonary effort is normal. No respiratory distress.     Breath sounds: Normal breath sounds. No wheezing or rales.  Abdominal:     General:  There is no distension.     Palpations: Abdomen is soft.  Tenderness: There is no abdominal tenderness.  Musculoskeletal:        General: No deformity.     Cervical back: Neck supple.     Right lower leg: No edema.     Left lower leg: No edema.  Lymphadenopathy:     Cervical: No cervical adenopathy.  Skin:    General: Skin is warm and dry.     Findings: No rash.  Neurological:     Mental Status: He is alert and oriented to person, place, and time. Mental status is at baseline.     Gait: Gait normal.  Psychiatric:        Mood and Affect: Mood normal.        Behavior: Behavior normal.        Thought Content: Thought content normal.     Last depression screening scores    01/28/2022    9:45 AM 10/26/2020    9:12 AM 03/23/2020    3:56 PM  PHQ 2/9 Scores  PHQ - 2 Score 1 1 1   PHQ- 9 Score 2 3 2    Last fall risk screening    01/28/2022    9:44 AM  Fall Risk   Falls in the past year? 0  Number falls in past yr: 0  Injury with Fall? 0  Risk for fall due to : No Fall Risks  Follow up Falls evaluation completed   Last Audit-C alcohol use screening    01/28/2022    9:45 AM  Alcohol Use Disorder Test (AUDIT)  1. How often do you have a drink containing alcohol? 2  2. How many drinks containing alcohol do you have on a typical day when you are drinking? 0  3. How often do you have six or more drinks on one occasion? 1  AUDIT-C Score 3   A score of 3 or more in women, and 4 or more in men indicates increased risk for alcohol abuse, EXCEPT if all of the points are from question 1   No results found for any visits on 01/28/22.  Assessment & Plan    Routine Health Maintenance and Physical Exam  Exercise Activities and Dietary recommendations  Goals   None     Immunization History  Administered Date(s) Administered   Influenza Split 01/03/2015   Influenza,inj,Quad PF,6+ Mos 01/28/2022   Tdap 10/26/2020    Health Maintenance  Topic Date Due   COVID-19 Vaccine  (1) Never done   COLONOSCOPY (Pts 45-86yrs Insurance coverage will need to be confirmed)  Never done   Zoster Vaccines- Shingrix (1 of 2) Never done   TETANUS/TDAP  10/27/2030   INFLUENZA VACCINE  Completed   Hepatitis C Screening  Completed   HIV Screening  Completed   HPV VACCINES  Aged Out    Discussed health benefits of physical activity, and encouraged him to engage in regular exercise appropriate for his age and condition.  Problem List Items Addressed This Visit       Other   Obesity    Discussed importance of healthy weight management Discussed diet and exercise       Relevant Orders   Comprehensive metabolic panel   Lipid panel   Hemoglobin A1c   Other Visit Diagnoses     Annual physical exam    -  Primary   Relevant Orders   Comprehensive metabolic panel   Lipid panel   Hemoglobin A1c   Colon cancer screening       Relevant Orders  Ambulatory referral to Gastroenterology   Need for influenza vaccination       Relevant Orders   Flu Vaccine QUAD 27mo+IM (Fluarix, Fluzone & Alfiuria Quad PF) (Completed)   Hyperglycemia       Relevant Orders   Hemoglobin A1c        Return in about 1 year (around 01/29/2023) for CPE.     I, Shirlee Latch, MD, have reviewed all documentation for this visit. The documentation on 01/28/22 for the exam, diagnosis, procedures, and orders are all accurate and complete.   Sabriya Yono, Marzella Schlein, MD, MPH [facility]

## 2022-01-28 ENCOUNTER — Telehealth: Payer: Self-pay

## 2022-01-28 ENCOUNTER — Ambulatory Visit (INDEPENDENT_AMBULATORY_CARE_PROVIDER_SITE_OTHER): Payer: BC Managed Care – PPO | Admitting: Family Medicine

## 2022-01-28 ENCOUNTER — Other Ambulatory Visit: Payer: Self-pay

## 2022-01-28 ENCOUNTER — Encounter: Payer: Self-pay | Admitting: Family Medicine

## 2022-01-28 VITALS — BP 123/86 | HR 64 | Temp 98.3°F | Resp 16 | Ht 71.5 in | Wt 241.5 lb

## 2022-01-28 DIAGNOSIS — Z1211 Encounter for screening for malignant neoplasm of colon: Secondary | ICD-10-CM

## 2022-01-28 DIAGNOSIS — Z0000 Encounter for general adult medical examination without abnormal findings: Secondary | ICD-10-CM

## 2022-01-28 DIAGNOSIS — E669 Obesity, unspecified: Secondary | ICD-10-CM

## 2022-01-28 DIAGNOSIS — Z6833 Body mass index (BMI) 33.0-33.9, adult: Secondary | ICD-10-CM | POA: Diagnosis not present

## 2022-01-28 DIAGNOSIS — Z23 Encounter for immunization: Secondary | ICD-10-CM

## 2022-01-28 DIAGNOSIS — R739 Hyperglycemia, unspecified: Secondary | ICD-10-CM

## 2022-01-28 MED ORDER — NA SULFATE-K SULFATE-MG SULF 17.5-3.13-1.6 GM/177ML PO SOLN: 1.0000 | Freq: Once | ORAL | 0 refills | Status: AC

## 2022-01-28 NOTE — Telephone Encounter (Signed)
Gastroenterology Pre-Procedure Review  Request Date: 03/04/22 Requesting Physician: Dr. Vicente Males  PATIENT REVIEW QUESTIONS: The patient responded to the following health history questions as indicated:    1. Are you having any GI issues? no 2. Do you have a personal history of Polyps? no 3. Do you have a family history of Colon Cancer or Polyps? no 4. Diabetes Mellitus? no 5. Joint replacements in the past 12 months?no 6. Major health problems in the past 3 months?no 7. Any artificial heart valves, MVP, or defibrillator?no    MEDICATIONS & ALLERGIES:    Patient reports the following regarding taking any anticoagulation/antiplatelet therapy:   Plavix, Coumadin, Eliquis, Xarelto, Lovenox, Pradaxa, Brilinta, or Effient? no Aspirin? no  Patient confirms/reports the following medications:  Current Outpatient Medications  Medication Sig Dispense Refill   albuterol (VENTOLIN HFA) 108 (90 Base) MCG/ACT inhaler INHALE 2 PUFFS INTO LUNGS EVERY 6 HOURS AS NEEDED FOR WHEEZE OR SHORTNESS OF BREATH 6.7 each 2   No current facility-administered medications for this visit.    Patient confirms/reports the following allergies:  No Known Allergies  No orders of the defined types were placed in this encounter.   AUTHORIZATION INFORMATION Primary Insurance: 1D#: Group #:  Secondary Insurance: 1D#: Group #:  SCHEDULE INFORMATION: Date: 03/04/22 Time: Location: [facility]

## 2022-01-28 NOTE — Assessment & Plan Note (Signed)
Discussed importance of healthy weight management Discussed diet and exercise  

## 2022-01-29 LAB — COMPREHENSIVE METABOLIC PANEL
ALT: 16 IU/L (ref 0–44)
AST: 14 IU/L (ref 0–40)
Albumin/Globulin Ratio: 1.2 (ref 1.2–2.2)
Albumin: 4.4 g/dL (ref 3.8–4.9)
Alkaline Phosphatase: 51 IU/L (ref 44–121)
BUN/Creatinine Ratio: 14 (ref 9–20)
BUN: 13 mg/dL (ref 6–24)
Bilirubin Total: 0.4 mg/dL (ref 0.0–1.2)
CO2: 20 mmol/L (ref 20–29)
Calcium: 8.7 mg/dL (ref 8.7–10.2)
Chloride: 104 mmol/L (ref 96–106)
Creatinine, Ser: 0.96 mg/dL (ref 0.76–1.27)
Globulin, Total: 3.6 g/dL (ref 1.5–4.5)
Glucose: 112 mg/dL — ABNORMAL HIGH (ref 70–99)
Potassium: 4.7 mmol/L (ref 3.5–5.2)
Sodium: 136 mmol/L (ref 134–144)
Total Protein: 8 g/dL (ref 6.0–8.5)
eGFR: 95 mL/min/{1.73_m2} (ref >59)

## 2022-01-29 LAB — LIPID PANEL
Chol/HDL Ratio: 3.1 ratio (ref 0.0–5.0)
Cholesterol, Total: 153 mg/dL (ref 100–199)
HDL: 50 mg/dL (ref >39)
LDL Chol Calc (NIH): 91 mg/dL (ref 0–99)
Triglycerides: 59 mg/dL (ref 0–149)
VLDL Cholesterol Cal: 12 mg/dL (ref 5–40)

## 2022-01-29 LAB — HEMOGLOBIN A1C
Est. average glucose Bld gHb Est-mCnc: 120 mg/dL
Hgb A1c MFr Bld: 5.8 % — ABNORMAL HIGH (ref 4.8–5.6)

## 2022-03-01 ENCOUNTER — Encounter: Payer: Self-pay | Admitting: Gastroenterology

## 2022-03-04 ENCOUNTER — Encounter
Admission: RE | Disposition: A | Discharge status: Home / Self Care | Payer: Self-pay | Source: Ambulatory Visit | Attending: Gastroenterology

## 2022-03-04 ENCOUNTER — Ambulatory Visit: Payer: BC Managed Care – PPO | Admitting: Anesthesiology

## 2022-03-04 ENCOUNTER — Ambulatory Visit
Admission: RE | Admit: 2022-03-04 | Discharge: 2022-03-04 | Disposition: A | Discharge status: Home / Self Care | Payer: BC Managed Care – PPO | Source: Ambulatory Visit | Attending: Gastroenterology | Admitting: Gastroenterology

## 2022-03-04 DIAGNOSIS — E669 Obesity, unspecified: Secondary | ICD-10-CM | POA: Diagnosis not present

## 2022-03-04 DIAGNOSIS — D126 Benign neoplasm of colon, unspecified: Secondary | ICD-10-CM

## 2022-03-04 DIAGNOSIS — Z1211 Encounter for screening for malignant neoplasm of colon: Secondary | ICD-10-CM

## 2022-03-04 DIAGNOSIS — K5730 Diverticulosis of large intestine without perforation or abscess without bleeding: Secondary | ICD-10-CM | POA: Insufficient documentation

## 2022-03-04 DIAGNOSIS — Z87891 Personal history of nicotine dependence: Secondary | ICD-10-CM | POA: Insufficient documentation

## 2022-03-04 DIAGNOSIS — D124 Benign neoplasm of descending colon: Secondary | ICD-10-CM | POA: Diagnosis not present

## 2022-03-04 DIAGNOSIS — K635 Polyp of colon: Secondary | ICD-10-CM | POA: Diagnosis not present

## 2022-03-04 DIAGNOSIS — K5790 Diverticulosis of intestine, part unspecified, without perforation or abscess without bleeding: Secondary | ICD-10-CM | POA: Diagnosis not present

## 2022-03-04 HISTORY — PX: COLONOSCOPY WITH PROPOFOL: SHX5780

## 2022-03-04 SURGERY — COLONOSCOPY WITH PROPOFOL
Anesthesia: General

## 2022-03-04 MED ORDER — SODIUM CHLORIDE 0.9 % IV SOLN
INTRAVENOUS | Status: DC
  Administered 2022-03-04: 20 mL/h via INTRAVENOUS

## 2022-03-04 MED ORDER — PROPOFOL 1000 MG/100ML IV EMUL
INTRAVENOUS | Status: AC
  Filled 2022-03-04: qty 100

## 2022-03-04 MED ORDER — PROPOFOL 10 MG/ML IV BOLUS
INTRAVENOUS | Status: DC | PRN
  Administered 2022-03-04: 70 mg via INTRAVENOUS

## 2022-03-04 MED ORDER — PROPOFOL 500 MG/50ML IV EMUL
INTRAVENOUS | Status: DC | PRN
  Administered 2022-03-04: 150 ug/kg/min via INTRAVENOUS

## 2022-03-04 NOTE — Transfer of Care (Signed)
Immediate Anesthesia Transfer of Care Note  Patient: Adrian Arnold  Procedure(s) Performed: COLONOSCOPY WITH PROPOFOL  Patient Location: PACU  Anesthesia Type:General  Level of Consciousness: awake, drowsy, and patient cooperative  Airway & Oxygen Therapy: Patient Spontanous Breathing and Patient connected to nasal cannula oxygen  Post-op Assessment: Report given to RN and Post -op Vital signs reviewed and stable  Post vital signs: Reviewed and stable  Last Vitals:  Vitals Value Taken Time  BP 84/49 03/04/22 1128  Temp 35.8 C 03/04/22 1125  Pulse 77 03/04/22 1129  Resp 33 03/04/22 1129  SpO2 97 % 03/04/22 1129  Vitals shown include unvalidated device data.  Last Pain:  Vitals:   03/04/22 1125  TempSrc: Temporal  PainSc: 0-No pain         Complications: No notable events documented.

## 2022-03-04 NOTE — Anesthesia Procedure Notes (Signed)
Procedure Name: MAC Date/Time: 03/04/2022 11:05 AM  Performed by: Jerrye Noble, CRNAPre-anesthesia Checklist: Patient identified, Emergency Drugs available, Suction available and Patient being monitored Patient Re-evaluated:Patient Re-evaluated prior to induction Oxygen Delivery Method: Nasal cannula

## 2022-03-04 NOTE — Op Note (Signed)
[facility] Patient Name: Adrian Arnold Procedure Date: 03/04/2022 11:07 AM MRN: [id number] Account #: [id number] Date of Birth: 1970-09-12 Admit Type: Outpatient Age: 51 Room: [facility] ENDO ROOM 1 Gender: Male Note Status: Finalized Instrument Name: Jasper Riling [id number] Procedure:             Colonoscopy Indications:           Screening for colorectal malignant neoplasm Providers:             Jonathon Bellows MD, MD Referring MD:          Dionne Bucy. Bacigalupo (Referring MD) Medicines:             Monitored Anesthesia Care Complications:         No immediate complications. Procedure:             Pre-Anesthesia Assessment:                        - Prior to the procedure, a History and Physical was                         performed, and patient medications, allergies and                         sensitivities were reviewed. The patient's tolerance                         of previous anesthesia was reviewed.                        - The risks and benefits of the procedure and the                         sedation options and risks were discussed with the                         patient. All questions were answered and informed                         consent was obtained.                        - ASA Grade Assessment: II - A patient with mild                         systemic disease.                        After obtaining informed consent, the colonoscope was                         passed under direct vision. Throughout the procedure,                         the patient's blood pressure, pulse, and oxygen                         saturations were monitored continuously. The                         Colonoscope was introduced  through the anus and                         advanced to the the cecum, identified by the                         appendiceal orifice. The colonoscopy was performed                         with ease. The patient tolerated the procedure well.                          The quality of the bowel preparation was excellent.                         The appendiceal orifice was photographed. Findings:      The perianal and digital rectal examinations were normal.      A 5 mm polyp was found in the descending colon. The polyp was sessile.       The polyp was removed with a cold snare. Resection and retrieval were       complete.      Multiple medium-mouthed diverticula were found in the sigmoid colon.      The exam was otherwise without abnormality on direct and retroflexion       views. Impression:            - One 5 mm polyp in the descending colon, removed with                         a cold snare. Resected and retrieved.                        - Diverticulosis in the sigmoid colon.                        - The examination was otherwise normal on direct and                         retroflexion views. Recommendation:        - Discharge patient to home (with escort).                        - Resume previous diet.                        - Continue present medications.                        - Await pathology results.                        - Await pathology results.                        - Repeat colonoscopy for surveillance based on                         pathology results. Procedure Code(s):     --- Professional ---  45385, Colonoscopy, flexible; with removal of                         tumor(s), polyp(s), or other lesion(s) by snare                         technique Diagnosis Code(s):     --- Professional ---                        Z12.11, Encounter for screening for malignant neoplasm                         of colon                        D12.4, Benign neoplasm of descending colon                        K57.30, Diverticulosis of large intestine without                         perforation or abscess without bleeding CPT copyright 2022 American Medical Association. All rights reserved. The codes documented in  this report are preliminary and upon coder review may  be revised to meet current compliance requirements. Jonathon Bellows, MD Jonathon Bellows MD, MD 03/04/2022 11:23:04 AM This report has been signed electronically. Number of Addenda: 0 Note Initiated On: 03/04/2022 11:07 AM Scope Withdrawal Time: 0 hours 7 minutes 57 seconds  Total Procedure Duration: 0 hours 9 minutes 25 seconds  Estimated Blood Loss:  Estimated blood loss: none.      [facility]

## 2022-03-04 NOTE — Anesthesia Postprocedure Evaluation (Signed)
Anesthesia Post Note  Patient: Adrian Arnold  Procedure(s) Performed: COLONOSCOPY WITH PROPOFOL  Anesthesia Type: General Anesthetic complications: no   No notable events documented.   Last Vitals:  Vitals:   03/04/22 1139 03/04/22 1140  BP: 102/81 (!) 159/80  Pulse: (!) 54 (!) 112  Resp: (!) 24 15  Temp:    SpO2: 97% 97%    Last Pain:  Vitals:   03/04/22 1140  TempSrc:   PainSc: 0-No pain                 VAN STAVEREN,Orval Dortch

## 2022-03-04 NOTE — H&P (Signed)
Jonathon Bellows, MD [address] [place], [place], [place], [address] [address], [place], [place], [place], [address] Phone: [phone]  Fax: [phone]  Primary Care Physician:  Virginia Crews, MD   Pre-Procedure History & Physical: HPI:  Adrian Arnold is a 51 y.o. male is here for an colonoscopy.   History reviewed. No pertinent past medical history.  Past Surgical History:  Procedure Laterality Date   BRAIN SURGERY     skull fusion, nothing intracranial   EYE SURGERY     FRACTURE SURGERY      Prior to Admission medications   Medication Sig Start Date End Date Taking? Authorizing Provider  albuterol (VENTOLIN HFA) 108 (90 Base) MCG/ACT inhaler INHALE 2 PUFFS INTO LUNGS EVERY 6 HOURS AS NEEDED FOR WHEEZE OR SHORTNESS OF BREATH 04/12/21  Yes Bacigalupo, Dionne Bucy, MD    Allergies as of 01/28/2022   (No Known Allergies)    Family History  Problem Relation Age of Onset   Heart attack Mother 34   Stroke Mother 24   Prostate cancer Father 61   Gallbladder disease Sister    Gallbladder disease Brother    Heart attack Maternal Grandfather    Stroke Maternal Grandfather    Heart attack Paternal Grandmother    Colon cancer Maternal Uncle    Breast cancer Neg Hx     Social History   Socioeconomic History   Marital status: Married    Spouse name: Not on file   Number of children: 1   Years of education: Not on file   Highest education level: Not on file  Occupational History   Not on file  Tobacco Use   Smoking status: Former    Packs/day: 1.00    Years: 20.00    Total pack years: 20.00    Types: Cigarettes    Quit date: 2015    Years since quitting: 8.8   Smokeless tobacco: Current    Types: Snuff  Vaping Use   Vaping Use: Never used  Substance and Sexual Activity   Alcohol use: Yes    Alcohol/week: 2.0 standard drinks of alcohol    Types: 2 Cans of beer per week   Drug use: Never   Sexual activity: Yes    Partners: Female    Birth  control/protection: None  Other Topics Concern   Not on file  Social History Narrative   Not on file   Social Determinants of Health   Financial Resource Strain: Not on file  Food Insecurity: Not on file  Transportation Needs: Not on file  Physical Activity: Not on file  Stress: Not on file  Social Connections: Not on file  Intimate Partner Violence: Not on file    Review of Systems: See HPI, otherwise negative ROS  Physical Exam: BP 110/89   Pulse 62   Temp (!) 97.4 F (36.3 C) (Temporal)   Resp 20   Ht '5\' 11"'$  (1.803 m)   Wt 104.3 kg   SpO2 98%   BMI 32.08 kg/m  General:   Alert,  pleasant and cooperative in NAD Head:  Normocephalic and atraumatic. Neck:  Supple; no masses or thyromegaly. Lungs:  Clear throughout to auscultation, normal respiratory effort.    Heart:  +S1, +S2, Regular rate and rhythm, No edema. Abdomen:  Soft, nontender and nondistended. Normal bowel sounds, without guarding, and without rebound.   Neurologic:  Alert and  oriented x4;  grossly normal neurologically.  Impression/Plan: Adrian Arnold is  here for an colonoscopy to be performed for Screening colonoscopy average risk   Risks, benefits, limitations, and alternatives regarding  colonoscopy have been reviewed with the patient.  Questions have been answered.  All parties agreeable.   Jonathon Bellows, MD  03/04/2022, 10:59 AM

## 2022-03-04 NOTE — Anesthesia Preprocedure Evaluation (Signed)
Anesthesia Evaluation  Patient identified by MRN, date of birth, ID band Patient awake    Reviewed: Allergy & Precautions, NPO status , Patient's Chart, lab work & pertinent test results  Airway Mallampati: II  TM Distance: >3 FB Neck ROM: Full    Dental no notable dental hx. (+) Chipped   Pulmonary neg pulmonary ROS, former smoker   Pulmonary exam normal  + decreased breath sounds      Cardiovascular Exercise Tolerance: Good negative cardio ROS Normal cardiovascular exam Rhythm:Regular     Neuro/Psych negative neurological ROS  negative psych ROS   GI/Hepatic negative GI ROS, Neg liver ROS,,,  Endo/Other  negative endocrine ROS    Renal/GU negative Renal ROS  negative genitourinary   Musculoskeletal negative musculoskeletal ROS (+)    Abdominal  (+) + obese  Peds negative pediatric ROS (+)  Hematology negative hematology ROS (+)   Anesthesia Other Findings History reviewed. No pertinent past medical history.  Past Surgical History: No date: BRAIN SURGERY     Comment:  skull fusion, nothing intracranial No date: EYE SURGERY No date: FRACTURE SURGERY  BMI    Body Mass Index: 32.08 kg/m      Reproductive/Obstetrics negative OB ROS                             Anesthesia Physical Anesthesia Plan  ASA: 2  Anesthesia Plan: General   Post-op Pain Management:    Induction: Intravenous  PONV Risk Score and Plan: Propofol infusion and TIVA  Airway Management Planned: Natural Airway  Additional Equipment:   Intra-op Plan:   Post-operative Plan:   Informed Consent: I have reviewed the patients History and Physical, chart, labs and discussed the procedure including the risks, benefits and alternatives for the proposed anesthesia with the patient or authorized representative who has indicated his/her understanding and acceptance.     Dental Advisory Given  Plan Discussed  with: CRNA and Surgeon  Anesthesia Plan Comments:        Anesthesia Quick Evaluation

## 2022-03-05 ENCOUNTER — Encounter: Payer: Self-pay | Admitting: Gastroenterology

## 2022-03-05 LAB — SURGICAL PATHOLOGY

## 2022-06-03 NOTE — Progress Notes (Signed)
   I,Ellison Leisure S Syndey Jaskolski,acting as a scribe for Lavon Paganini, MD.,have documented all relevant documentation on the behalf of Lavon Paganini, MD,as directed by  Lavon Paganini, MD while in the presence of Lavon Paganini, MD.     Established patient visit   Patient: Adrian Arnold   DOB: 06/27/1970   52 y.o. Male  MRN: [id number] Visit Date: 06/04/2022  Today's healthcare provider: Lavon Paganini, MD   No chief complaint on file.  Subjective    HPI  Upper respiratory symptoms He complains of {uri sx's' brief:15453}.with {systemic_sx:15294}. Onset of symptoms was {onset initial:119223} and {progression:119226}.He {hydration history:15378}.  Past history is significant for {respiratory illness:412}. Patient is {smoker?:15292}  ---------------------------------------------------------------------------------------------------   Medications: Outpatient Medications Prior to Visit  Medication Sig   albuterol (VENTOLIN HFA) 108 (90 Base) MCG/ACT inhaler INHALE 2 PUFFS INTO LUNGS EVERY 6 HOURS AS NEEDED FOR WHEEZE OR SHORTNESS OF BREATH   No facility-administered medications prior to visit.    Review of Systems  {Labs  Heme  Chem  Endocrine  Serology  Results Review (optional):23779}   Objective    There were no vitals taken for this visit. {Show previous vital signs (optional):23777}  Physical Exam  ***  No results found for any visits on 06/04/22.  Assessment & Plan     ***  No follow-ups on file.      {provider attestation***:1}   Lavon Paganini, MD  [facility] [phone] (phone) [phone] (fax)  [facility]

## 2022-06-04 ENCOUNTER — Encounter: Payer: Self-pay | Admitting: Family Medicine

## 2022-06-04 ENCOUNTER — Ambulatory Visit: Payer: BC Managed Care – PPO | Admitting: Family Medicine

## 2022-06-04 VITALS — BP 123/81 | HR 65 | Temp 98.2°F | Resp 16 | Wt 248.0 lb

## 2022-06-04 DIAGNOSIS — E669 Obesity, unspecified: Secondary | ICD-10-CM

## 2022-06-04 DIAGNOSIS — R062 Wheezing: Secondary | ICD-10-CM

## 2022-06-04 DIAGNOSIS — Z87891 Personal history of nicotine dependence: Secondary | ICD-10-CM

## 2022-06-04 DIAGNOSIS — Z6834 Body mass index (BMI) 34.0-34.9, adult: Secondary | ICD-10-CM | POA: Diagnosis not present

## 2022-06-04 MED ORDER — ALBUTEROL SULFATE HFA 108 (90 BASE) MCG/ACT IN AERS: 1.0000 | INHALATION_SPRAY | Freq: Four times a day (QID) | RESPIRATORY_TRACT | 2 refills | Status: DC | PRN

## 2022-06-04 MED ORDER — FLUTICASONE FUROATE-VILANTEROL 200-25 MCG/ACT IN AEPB: 1.0000 | INHALATION_SPRAY | Freq: Every day | RESPIRATORY_TRACT | 5 refills | Status: DC

## 2022-06-04 NOTE — Assessment & Plan Note (Signed)
Discussed importance of healthy weight management Discussed diet and exercise  

## 2022-06-04 NOTE — Assessment & Plan Note (Signed)
Has not resumed smoking  Continued cessation

## 2022-06-04 NOTE — Assessment & Plan Note (Signed)
Concerned about possible COPD with h/o smoking and wheezing, esp overnight Albuterol helped Has already quit smoking which is great Will start Breo daily for baseline control Albuterol prn Referral to [facility] for possible PFTs and further eval

## 2022-06-07 ENCOUNTER — Telehealth: Payer: Self-pay | Admitting: Family Medicine

## 2022-06-07 ENCOUNTER — Other Ambulatory Visit: Payer: Self-pay | Admitting: Family Medicine

## 2022-06-07 MED ORDER — BREO ELLIPTA 200-25 MCG/ACT IN AEPB: 1.0000 | INHALATION_SPRAY | Freq: Every day | RESPIRATORY_TRACT | 5 refills | Status: DC

## 2022-06-07 NOTE — Telephone Encounter (Signed)
Requested medication (s) are due for refill today - no  Requested medication (s) are on the active medication list -yes  Future visit scheduled -yes  Last refill: 06/04/22  Notes to clinic: Pharmacy request: Alternative requested- not covered  Requested Prescriptions  Pending Prescriptions Disp Refills   BREO ELLIPTA 200-25 MCG/ACT AEPB [Pharmacy Med Name: BREO ELLIPTA 200-25 MCG INHALR] 60 each 5    Sig: TAKE 1 [place]     Pulmonology:  Combination Products Passed - 06/07/2022  2:45 PM      Passed - Valid encounter within last 12 months    Recent Outpatient Visits           3 days ago [place], Dionne Bucy, MD   4 months ago Annual physical exam   [place], Dionne Bucy, MD   1 year ago Annual physical exam   [place] Crews, MD   2 years ago Benign paroxysmal positional vertigo, unspecified laterality   [place], Dionne Bucy, MD   2 years ago Annual physical exam   [place], Vickki Muff, PA-C       Future Appointments             In 7 months [place], Dionne Bucy, MD [facility], [facility]               Requested Prescriptions  Pending Prescriptions Disp [place] 200-25 MCG/ACT AEPB [Pharmacy Med Name: [place] 200-25 MCG INHALR] 60 each 5    Sig: TAKE 1 [place]     Pulmonology:  Combination Products Passed - 06/07/2022  2:45 PM      Passed - Valid encounter within last 12 months    Recent Outpatient Visits           3 days ago [place], Dionne Bucy, MD   4 months ago Annual physical exam   [place], Dionne Bucy, MD   1 year ago Annual physical exam   [place] Crews, MD   2 years ago Benign paroxysmal positional vertigo, unspecified laterality   [place], Dionne Bucy, MD   2 years ago Annual physical exam   [place], PA-C       Future Appointments             In 7 months Bacigalupo, Dionne Bucy, MD [facility], [place]

## 2022-06-07 NOTE — Telephone Encounter (Signed)
Pts insurance wont cover fluticasone furoate-vilanterol (BREO ELLIPTA) 200-25 MCG/ACT AEPB / Pts insurance will cover the actual Brand Breo/ please advise and resend RX to  [id number] [place], [place] - [address]

## 2022-08-15 NOTE — Progress Notes (Signed)
Vivien Rota DeSanto,acting as a Neurosurgeon for [employer], DO.,have documented all relevant documentation on the behalf of [employer], DO,as directed by  [employer], DO while in the presence of Sherlyn Hay, DO.    Established patient visit   Patient: Adrian Arnold   DOB: 1971/04/02   52 y.o. Male  MRN: [id number] Visit Date: 08/16/2022  Today's healthcare provider: Sherlyn Hay, DO   Chief Complaint  Patient presents with   Umbilical Hernia   Subjective    HPI  Patient is a 52 year old male who presents for evaluation of possible umbilical hernia. States he has been aware of it for a couple of months but only became concerned in the last week.  He states it does not hurt terribly but felt at his age it should be addressed.  He has had no constipation or known strain.  He has always been able to reduce the bulge.  He has been having a very infrequent mild shooting pain.  He feels a small amount of tension at the superior aspect of the umbilicus, but it resolves when he lays down. He notes he feels something move when he lays on his side.  No prior abdominal surgeries.  Medications: Outpatient Medications Prior to Visit  Medication Sig   albuterol (VENTOLIN HFA) 108 (90 Base) MCG/ACT inhaler Inhale 1-2 puffs into the lungs every 6 (six) hours as needed for wheezing or shortness of breath.   BREO ELLIPTA 200-25 MCG/ACT AEPB Inhale 1 puff into the lungs daily.   No facility-administered medications prior to visit.    Review of Systems  Constitutional:  Negative for chills and fever.  Respiratory:  Negative for shortness of breath.   Cardiovascular:  Negative for chest pain.  Gastrointestinal:  Positive for nausea. Negative for abdominal distention, abdominal pain, blood in stool, constipation, diarrhea and vomiting.  Skin:  Negative for color change and rash.  Neurological:  Negative for dizziness.       Objective    BP (!) 124/94 (BP Location: Left Arm, Patient  Position: Sitting, Cuff Size: Normal)   Pulse 76   Temp (!) 97.5 F (36.4 C) (Oral)   Wt 248 lb (112.5 kg)   SpO2 96%   BMI 34.59 kg/m  Vitals:   08/16/22 0931 08/16/22 0934  BP: (!) 133/91 (!) 124/94  Pulse: 76   Temp: (!) 97.5 F (36.4 C)   SpO2: 96%      Physical Exam Vitals reviewed.  Constitutional:      General: He is not in acute distress.    Appearance: Normal appearance. He is not diaphoretic.  HENT:     Head: Normocephalic and atraumatic.  Eyes:     General: No scleral icterus.    Conjunctiva/sclera: Conjunctivae normal.  Cardiovascular:     Heart sounds: No murmur heard. Pulmonary:     Effort: No respiratory distress.     Breath sounds: No wheezing or rhonchi.  Abdominal:     General: There is no distension.     Palpations: Abdomen is soft. There is no mass.     Tenderness: There is no abdominal tenderness.     Hernia: A hernia is present. Hernia is present in the umbilical area.    Musculoskeletal:     Right lower leg: No edema.     Left lower leg: No edema.  Lymphadenopathy:     Cervical: No cervical adenopathy.  Skin:    General: Skin  is warm and dry.     Findings: No rash.  Neurological:     Mental Status: He is alert and oriented to person, place, and time. Mental status is at baseline.  Psychiatric:        Mood and Affect: Mood normal.        Behavior: Behavior normal.      No results found for any visits on 08/16/22.  Assessment & Plan     1. Umbilical hernia without obstruction or gangrene There does appear to be a abdominal wall defect with physical examination which is very small.  However, we will go ahead and order an ultrasound for further evaluation.  With this information, patient will reconsider meeting with a surgeon to discuss the benefits and drawbacks of potential surgery to repair this.  Patient is aware to seek medical intervention if he develops significant and/or persistent pain. - US Abdomen Limited; Future  2.  Elevated blood pressure reading in office without diagnosis of hypertension Patient did have an elevated blood pressure during this visit.  Patient notes that his blood pressure has previously been elevated on some visits but typically returns back down to normal.  Will continue to monitor.   No follow-ups on file.      The entirety of the information documented in the History of Present Illness, Review of Systems and Physical Exam were personally obtained by me. Portions of this information were initially documented by the CMA, Adline Peals, and reviewed by me for thoroughness and accuracy.     Sherlyn Hay, DO  [facility] [phone] (phone) [phone] (fax)  [facility]

## 2022-08-16 ENCOUNTER — Ambulatory Visit: Payer: BC Managed Care – PPO | Admitting: Family Medicine

## 2022-08-16 VITALS — BP 124/94 | HR 76 | Temp 97.5°F | Wt 248.0 lb

## 2022-08-16 DIAGNOSIS — K429 Umbilical hernia without obstruction or gangrene: Secondary | ICD-10-CM

## 2022-08-16 DIAGNOSIS — R030 Elevated blood-pressure reading, without diagnosis of hypertension: Secondary | ICD-10-CM | POA: Diagnosis not present

## 2022-08-19 ENCOUNTER — Ambulatory Visit
Admission: RE | Admit: 2022-08-19 | Discharge: 2022-08-19 | Disposition: A | Discharge status: Home / Self Care | Payer: BC Managed Care – PPO | Source: Ambulatory Visit | Attending: Family Medicine | Admitting: Family Medicine

## 2022-08-19 DIAGNOSIS — K429 Umbilical hernia without obstruction or gangrene: Secondary | ICD-10-CM | POA: Diagnosis not present

## 2022-08-19 DIAGNOSIS — R109 Unspecified abdominal pain: Secondary | ICD-10-CM | POA: Diagnosis not present

## 2022-09-03 ENCOUNTER — Other Ambulatory Visit: Payer: Self-pay | Admitting: Family Medicine

## 2023-01-30 ENCOUNTER — Encounter: Payer: Self-pay | Admitting: Family Medicine

## 2023-01-30 ENCOUNTER — Ambulatory Visit: Payer: BC Managed Care – PPO | Admitting: Family Medicine

## 2023-01-30 VITALS — BP 124/86 | HR 55 | Temp 97.7°F | Ht 71.0 in | Wt 251.9 lb

## 2023-01-30 DIAGNOSIS — E66812 Obesity, class 2: Secondary | ICD-10-CM

## 2023-01-30 DIAGNOSIS — Z0000 Encounter for general adult medical examination without abnormal findings: Secondary | ICD-10-CM

## 2023-01-30 DIAGNOSIS — Z23 Encounter for immunization: Secondary | ICD-10-CM

## 2023-01-30 DIAGNOSIS — Z6835 Body mass index (BMI) 35.0-35.9, adult: Secondary | ICD-10-CM | POA: Diagnosis not present

## 2023-01-30 DIAGNOSIS — R7303 Prediabetes: Secondary | ICD-10-CM

## 2023-01-30 DIAGNOSIS — Z0001 Encounter for general adult medical examination with abnormal findings: Secondary | ICD-10-CM | POA: Diagnosis not present

## 2023-01-30 NOTE — Assessment & Plan Note (Signed)
Weight Gain Patient reports significant weight gain without changes in diet or alcohol consumption. Acknowledges decreased activity and high-stress work environment. Expressed interest in weight loss medications if necessary, but prefers not to take long-term medications. -Ordered labs to assess metabolic status including A1c, liver and kidney function, and cholesterol. -Discussed importance of regular physical activity and stress management.

## 2023-01-30 NOTE — Progress Notes (Signed)
Complete physical exam  Patient: Adrian Arnold   DOB: [birth year]   52 y.o. Male  MRN: [id number]  Subjective:    Chief Complaint  Patient presents with   Annual Exam    Annual physical    Adrian Arnold is a 51 y.o. male who presents today for a complete physical exam. He reports consuming a general diet.  He generally feels well. He reports sleeping fairly well. He does not have additional problems to discuss today.   Discussed the use of AI scribe software for clinical note transcription with the patient, who gave verbal consent to proceed.  History of Present Illness   The patient, with a history of obesity, presents with recent weight gain despite no reported changes in diet or activity levels. The patient denies increased food intake or decreased physical activity. The patient also denies increased alcohol consumption. The patient reports high caffeine intake, consuming multiple energy drinks daily. The patient also reports feeling a lump in the abdomen, which is not painful but occasionally uncomfortable. The patient expresses concern about the weight gain and is open to exploring weight loss medications if necessary but prefers not to take multiple medications long-term.        Most recent fall risk assessment:    01/30/2023    8:53 AM  Fall Risk   Falls in the past year? 0  Number falls in past yr: 0  Injury with Fall? 0  Risk for fall due to : No Fall Risks  Follow up Falls evaluation completed     Most recent depression screenings:    01/30/2023    8:53 AM 08/16/2022    9:33 AM  PHQ 2/9 Scores  PHQ - 2 Score 1 0  PHQ- 9 Score 4 1        Patient Care Team: Erasmo Downer, MD as PCP - General (Family Medicine)   Outpatient Medications Prior to Visit  Medication Sig   BREO ELLIPTA 200-25 MCG/ACT AEPB Inhale 1 puff into the lungs daily.   albuterol (VENTOLIN HFA) 108 (90 Base) MCG/ACT inhaler INHALE 1-2 PUFFS BY MOUTH EVERY 6 HOURS AS NEEDED FOR WHEEZE OR  SHORTNESS OF BREATH (Patient not taking: Reported on 01/30/2023)   No facility-administered medications prior to visit.    ROS      Objective:     BP 124/86 (BP Location: Right Arm, Patient Position: Sitting, Cuff Size: Large)   Pulse (!) 55   Temp 97.7 F (36.5 C) (Oral)   Ht 5\' 11"  (1.803 m)   Wt 251 lb 14.4 oz (114.3 kg)   SpO2 97%   BMI 35.13 kg/m    Physical Exam Vitals reviewed.  Constitutional:      General: He is not in acute distress.    Appearance: Normal appearance. He is well-developed. He is not diaphoretic.  HENT:     Head: Normocephalic and atraumatic.     Right Ear: Tympanic membrane, ear canal and external ear normal.     Left Ear: Tympanic membrane, ear canal and external ear normal.     Nose: Nose normal.     Mouth/Throat:     Mouth: Mucous membranes are moist.     Pharynx: Oropharynx is clear. No oropharyngeal exudate.  Eyes:     General: No scleral icterus.    Conjunctiva/sclera: Conjunctivae normal.     Pupils: Pupils are equal, round, and reactive to light.  Neck:     Thyroid: No thyromegaly.  Cardiovascular:  Rate and Rhythm: Normal rate and regular rhythm.     Heart sounds: Normal heart sounds. No murmur heard. Pulmonary:     Effort: Pulmonary effort is normal. No respiratory distress.     Breath sounds: Normal breath sounds. No wheezing or rales.  Abdominal:     General: There is no distension.     Palpations: Abdomen is soft.     Tenderness: There is no abdominal tenderness.  Musculoskeletal:        General: No deformity.     Cervical back: Neck supple.     Right lower leg: No edema.     Left lower leg: No edema.  Lymphadenopathy:     Cervical: No cervical adenopathy.  Skin:    General: Skin is warm and dry.     Findings: No rash.  Neurological:     Mental Status: He is alert and oriented to person, place, and time. Mental status is at baseline.     Gait: Gait normal.  Psychiatric:        Mood and Affect: Mood normal.         Behavior: Behavior normal.        Thought Content: Thought content normal.      No results found for any visits on 01/30/23.     Assessment & Plan:    Routine Health Maintenance and Physical Exam  Immunization History  Administered Date(s) Administered   Influenza Split 01/03/2015   Influenza, Seasonal, Injecte, Preservative Fre 01/30/2023   Influenza,inj,Quad PF,6+ Mos 01/28/2022   Tdap 10/26/2020    Health Maintenance  Topic Date Due   Lung Cancer Screening  06/05/2018   Zoster Vaccines- Shingrix (1 of 2) Never done   COVID-19 Vaccine (1 - 2023-24 season) Never done   Colonoscopy  03/05/2027   DTaP/Tdap/Td (2 - Td or Tdap) 10/27/2030   INFLUENZA VACCINE  Completed   Hepatitis C Screening  Completed   HIV Screening  Completed   HPV VACCINES  Aged Out    Discussed health benefits of physical activity, and encouraged him to engage in regular exercise appropriate for his age and condition.  Problem List Items Addressed This Visit       Other   Obesity    Weight Gain Patient reports significant weight gain without changes in diet or alcohol consumption. Acknowledges decreased activity and high-stress work environment. Expressed interest in weight loss medications if necessary, but prefers not to take long-term medications. -Ordered labs to assess metabolic status including A1c, liver and kidney function, and cholesterol. -Discussed importance of regular physical activity and stress management.      Relevant Orders   Lipid panel   Comprehensive metabolic panel   Hemoglobin A1c   Other Visit Diagnoses     Encounter for annual physical exam    -  Primary   Relevant Orders   Lipid panel   Comprehensive metabolic panel   Hemoglobin A1c   Prediabetes       Relevant Orders   Hemoglobin A1c   Immunization due       Relevant Orders   Flu vaccine trivalent PF, 6mos and older(Flulaval,Afluria,Fluarix,Fluzone) (Completed)   Influenza vaccine needed        Relevant Orders   Flu vaccine trivalent PF, 6mos and older(Flulaval,Afluria,Fluarix,Fluzone) (Completed)           Initial blood pressure reading was elevated, but decreased upon recheck. -Continue to monitor blood pressure.  General Health Maintenance -Administer influenza vaccine today. -Schedule annual physical for  next year. -Discussed potential future need for weight loss medications depending on lab results.        Return in about 1 year (around 01/30/2024) for CPE.     Shirlee Latch, MD

## 2023-01-31 LAB — LIPID PANEL
Chol/HDL Ratio: 3.3 {ratio} (ref 0.0–5.0)
Cholesterol, Total: 165 mg/dL (ref 100–199)
HDL: 50 mg/dL (ref >39)
LDL Chol Calc (NIH): 103 mg/dL — ABNORMAL HIGH (ref 0–99)
Triglycerides: 60 mg/dL (ref 0–149)
VLDL Cholesterol Cal: 12 mg/dL (ref 5–40)

## 2023-01-31 LAB — COMPREHENSIVE METABOLIC PANEL
ALT: 28 [IU]/L (ref 0–44)
AST: 18 [IU]/L (ref 0–40)
Albumin: 4.2 g/dL (ref 3.8–4.9)
Alkaline Phosphatase: 50 [IU]/L (ref 44–121)
BUN/Creatinine Ratio: 10 (ref 9–20)
BUN: 10 mg/dL (ref 6–24)
Bilirubin Total: 0.4 mg/dL (ref 0.0–1.2)
CO2: 22 mmol/L (ref 20–29)
Calcium: 9.1 mg/dL (ref 8.7–10.2)
Chloride: 102 mmol/L (ref 96–106)
Creatinine, Ser: 0.97 mg/dL (ref 0.76–1.27)
Globulin, Total: 2.7 g/dL (ref 1.5–4.5)
Glucose: 107 mg/dL — ABNORMAL HIGH (ref 70–99)
Potassium: 4.9 mmol/L (ref 3.5–5.2)
Sodium: 139 mmol/L (ref 134–144)
Total Protein: 6.9 g/dL (ref 6.0–8.5)
eGFR: 93 mL/min/{1.73_m2} (ref >59)

## 2023-01-31 LAB — HEMOGLOBIN A1C
Est. average glucose Bld gHb Est-mCnc: 120 mg/dL
Hgb A1c MFr Bld: 5.8 % — ABNORMAL HIGH (ref 4.8–5.6)

## 2024-01-30 ENCOUNTER — Encounter: Payer: Self-pay | Admitting: Family Medicine

## 2024-02-02 ENCOUNTER — Ambulatory Visit (INDEPENDENT_AMBULATORY_CARE_PROVIDER_SITE_OTHER): Admitting: Family Medicine

## 2024-02-02 ENCOUNTER — Encounter: Payer: Self-pay | Admitting: Family Medicine

## 2024-02-02 VITALS — BP 122/89 | HR 63 | Ht 71.0 in | Wt 202.0 lb

## 2024-02-02 DIAGNOSIS — Z0001 Encounter for general adult medical examination with abnormal findings: Secondary | ICD-10-CM

## 2024-02-02 DIAGNOSIS — E663 Overweight: Secondary | ICD-10-CM | POA: Diagnosis not present

## 2024-02-02 DIAGNOSIS — Z789 Other specified health status: Secondary | ICD-10-CM | POA: Diagnosis not present

## 2024-02-02 DIAGNOSIS — Z23 Encounter for immunization: Secondary | ICD-10-CM | POA: Diagnosis not present

## 2024-02-02 DIAGNOSIS — R7303 Prediabetes: Secondary | ICD-10-CM

## 2024-02-02 DIAGNOSIS — Z0000 Encounter for general adult medical examination without abnormal findings: Secondary | ICD-10-CM

## 2024-02-02 DIAGNOSIS — Z87891 Personal history of nicotine dependence: Secondary | ICD-10-CM

## 2024-02-02 DIAGNOSIS — Z125 Encounter for screening for malignant neoplasm of prostate: Secondary | ICD-10-CM

## 2024-02-02 NOTE — Progress Notes (Signed)
 Complete physical exam   Patient: Adrian Arnold   DOB: August 11, 1970   53 y.o. Male  MRN: [id number] Visit Date: 02/02/2024  Today's healthcare provider: Jon Eva, MD   Chief Complaint  Patient presents with   Annual Exam    Last completed 01/30/23 Diet -  Low carb, keto Exercise - strenuous job Feeling - well Sleeping -fairly well Concerns -  none   Subjective    Adrian Arnold is a 53 y.o. male who presents today for a complete physical exam.    Discussed the use of AI scribe software for clinical note transcription with the patient, who gave verbal consent to proceed.  History of Present Illness   Adrian Arnold is a 53 year old male with COPD who presents for a routine follow-up visit.  He uses Breo as his controller medication and Albuterol  as a rescue inhaler, which he has not needed today. He feels better overall. He has a history of smoking but has not resumed smoking.  He is actively working on weight loss, having reduced from 238 pounds to 196 pounds since June through a low-carb diet and increased physical activity. He has no cravings for high-carb foods and resists temptations.  His blood pressure has improved with weight loss and lifestyle changes, though it tends to be elevated during medical visits due to stress.  He has an umbilical hernia with no pain or discomfort.        Last depression screening scores    02/02/2024    2:46 PM 01/30/2023    8:53 AM 08/16/2022    9:33 AM  PHQ 2/9 Scores  PHQ - 2 Score 0 1 0  PHQ- 9 Score  4 1   Last fall risk screening    01/30/2023    8:53 AM  Fall Risk   Falls in the past year? 0  Number falls in past yr: 0  Injury with Fall? 0  Risk for fall due to : No Fall Risks  Follow up Falls evaluation completed        Medications: Outpatient Medications Prior to Visit  Medication Sig   [DISCONTINUED] albuterol  (VENTOLIN  HFA) 108 (90 Base) MCG/ACT inhaler INHALE 1-2 PUFFS BY MOUTH EVERY 6 HOURS AS  NEEDED FOR WHEEZE OR SHORTNESS OF BREATH (Patient not taking: Reported on 02/02/2024)   [DISCONTINUED] BREO ELLIPTA  200-25 MCG/ACT AEPB Inhale 1 puff into the lungs daily. (Patient not taking: Reported on 02/02/2024)   No facility-administered medications prior to visit.    Review of Systems    Objective    BP 122/89 (BP Location: Left Arm, Patient Position: Sitting, Cuff Size: Normal)   Pulse 63   Ht 5' 11 (1.803 m)   Wt 202 lb (91.6 kg)   SpO2 98%   BMI 28.17 kg/m    Physical Exam Vitals reviewed.  Constitutional:      General: He is not in acute distress.    Appearance: Normal appearance. He is well-developed. He is not diaphoretic.  HENT:     Head: Normocephalic and atraumatic.     Right Ear: Tympanic membrane, ear canal and external ear normal.     Left Ear: Tympanic membrane, ear canal and external ear normal.     Nose: Nose normal.     Mouth/Throat:     Mouth: Mucous membranes are moist.     Pharynx: Oropharynx is clear. No oropharyngeal exudate.  Eyes:     General: No scleral icterus.  Conjunctiva/sclera: Conjunctivae normal.     Pupils: Pupils are equal, round, and reactive to light.  Neck:     Thyroid: No thyromegaly.  Cardiovascular:     Rate and Rhythm: Normal rate and regular rhythm.     Pulses: Normal pulses.     Heart sounds: Normal heart sounds. No murmur heard. Pulmonary:     Effort: Pulmonary effort is normal. No respiratory distress.     Breath sounds: Normal breath sounds. No wheezing or rales.  Abdominal:     General: There is no distension.     Palpations: Abdomen is soft.     Tenderness: There is no abdominal tenderness.     Hernia: A hernia (small, umbilical) is present.  Musculoskeletal:        General: No deformity.     Cervical back: Neck supple.     Right lower leg: No edema.     Left lower leg: No edema.  Lymphadenopathy:     Cervical: No cervical adenopathy.  Skin:    General: Skin is warm and dry.     Findings: No rash.   Neurological:     Mental Status: He is alert and oriented to person, place, and time. Mental status is at baseline.     Gait: Gait normal.  Psychiatric:        Mood and Affect: Mood normal.        Behavior: Behavior normal.        Thought Content: Thought content normal.      No results found for any visits on 02/02/24.  Assessment & Plan    Routine Health Maintenance and Physical Exam  Exercise Activities and Dietary recommendations  Goals   None     Immunization History  Administered Date(s) Administered   Influenza Split 01/03/2015   Influenza, Seasonal, Injecte, Preservative Fre 01/30/2023, 02/02/2024   Influenza,inj,Quad PF,6+ Mos 01/28/2022   PNEUMOCOCCAL CONJUGATE-20 02/02/2024   Tdap 10/26/2020    Health Maintenance  Topic Date Due   Hepatitis B Vaccines 19-59 Average Risk (1 of 3 - 19+ 3-dose series) Never done   Lung Cancer Screening  06/05/2018   Zoster Vaccines- Shingrix (1 of 2) Never done   COVID-19 Vaccine (1 - 2025-26 season) Never done   Colonoscopy  03/05/2027   DTaP/Tdap/Td (2 - Td or Tdap) 10/27/2030   Pneumococcal Vaccine: 50+ Years  Completed   Influenza Vaccine  Completed   Hepatitis C Screening  Completed   HIV Screening  Completed   HPV VACCINES  Aged Out   Meningococcal B Vaccine  Aged Out    Discussed health benefits of physical activity, and encouraged him to engage in regular exercise appropriate for his age and condition.  Problem List Items Addressed This Visit       Other   History of tobacco use   Relevant Orders   Ambulatory Referral for Lung Cancer Screening [REF832]   Overweight   Relevant Orders   Comprehensive metabolic panel with GFR   Lipid Panel With LDL/HDL Ratio   Hemoglobin A1c   Other Visit Diagnoses       Encounter for annual physical exam    -  Primary   Relevant Orders   Comprehensive metabolic panel with GFR   Lipid Panel With LDL/HDL Ratio     Immunization due       Relevant Orders   Flu vaccine  trivalent PF, 6mos and older(Flulaval,Afluria,Fluarix,Fluzone) (Completed)   Pneumococcal conjugate vaccine 20-valent (Completed)     Prediabetes  Relevant Orders   Comprehensive metabolic panel with GFR   Lipid Panel With LDL/HDL Ratio   Hemoglobin A1c     Hepatitis B vaccination status unknown       Relevant Orders   Hepatitis B Surface AntiBODY     Prostate cancer screening       Relevant Orders   PSA Total (Reflex To Free)           Adult Wellness Visit Routine adult wellness visit with improved blood pressure due to weight loss and dietary changes. Weight decreased from 238 lbs to 202 lbs since June, with a target of 195 lbs. Adherence to a low-carb diet noted. - Perform lab tests: cholesterol, A1c, kidney and liver function, PSA for prostate cancer screening. - Administer hepatitis B immunity test. - Administer flu and pneumonia vaccines. - Schedule lung cancer screening CT scan around holiday availability.  Obesity, class 2, now overweight Significant weight loss from 238 lbs to 202 lbs since June, attributed to dietary changes and increased physical activity. Goal to reach 195 lbs. Weight loss has positively impacted blood pressure.  Prediabetes Prediabetes is being monitored. Weight loss and dietary changes are expected to improve glucose levels. - Perform A1c test to monitor glucose levels.  Tobacco use Currently using chewing tobacco. Discussed lung cancer screening due to smoking history. - Schedule lung cancer screening CT scan.  Umbilical hernia Umbilical hernia is present but asymptomatic. No pain or discomfort reported.       Return in about 1 year (around 02/01/2025) for CPE.      Jon Eva, MD  [facility] [phone] (phone) [phone] (fax)  [facility]

## 2024-02-03 ENCOUNTER — Ambulatory Visit: Payer: Self-pay | Admitting: Family Medicine

## 2024-02-03 LAB — COMPREHENSIVE METABOLIC PANEL WITH GFR
ALT: 17 IU/L (ref 0–44)
AST: 15 IU/L (ref 0–40)
Albumin: 4.4 g/dL (ref 3.8–4.9)
Alkaline Phosphatase: 47 IU/L (ref 47–123)
BUN/Creatinine Ratio: 18 (ref 9–20)
BUN: 14 mg/dL (ref 6–24)
Bilirubin Total: 0.6 mg/dL (ref 0.0–1.2)
CO2: 22 mmol/L (ref 20–29)
Calcium: 9.5 mg/dL (ref 8.7–10.2)
Chloride: 100 mmol/L (ref 96–106)
Creatinine, Ser: 0.8 mg/dL (ref 0.76–1.27)
Globulin, Total: 2.4 g/dL (ref 1.5–4.5)
Glucose: 83 mg/dL (ref 70–99)
Potassium: 4.4 mmol/L (ref 3.5–5.2)
Sodium: 136 mmol/L (ref 134–144)
Total Protein: 6.8 g/dL (ref 6.0–8.5)
eGFR: 105 mL/min/1.73 (ref >59)

## 2024-02-03 LAB — LIPID PANEL WITH LDL/HDL RATIO
Cholesterol, Total: 176 mg/dL (ref 100–199)
HDL: 52 mg/dL (ref >39)
LDL Chol Calc (NIH): 116 mg/dL — ABNORMAL HIGH (ref 0–99)
LDL/HDL Ratio: 2.2 ratio (ref 0.0–3.6)
Triglycerides: 39 mg/dL (ref 0–149)
VLDL Cholesterol Cal: 8 mg/dL (ref 5–40)

## 2024-02-03 LAB — HEPATITIS B SURFACE ANTIBODY,QUALITATIVE: Hep B Surface Ab, Qual: NONREACTIVE

## 2024-02-03 LAB — PSA TOTAL (REFLEX TO FREE): Prostate Specific Ag, Serum: 0.7 ng/mL (ref 0.0–4.0)

## 2024-02-03 LAB — HEMOGLOBIN A1C
Est. average glucose Bld gHb Est-mCnc: 108 mg/dL
Hgb A1c MFr Bld: 5.4 % (ref 4.8–5.6)

## 2024-04-20 ENCOUNTER — Ambulatory Visit: Admitting: Family Medicine

## 2024-04-20 ENCOUNTER — Encounter: Payer: Self-pay | Admitting: Family Medicine

## 2024-04-20 VITALS — BP 111/76 | HR 65 | Resp 14 | Ht 71.5 in | Wt 193.2 lb

## 2024-04-20 DIAGNOSIS — R1084 Generalized abdominal pain: Secondary | ICD-10-CM

## 2024-04-20 DIAGNOSIS — N410 Acute prostatitis: Secondary | ICD-10-CM | POA: Diagnosis not present

## 2024-04-20 LAB — POCT URINALYSIS DIPSTICK
Bilirubin, UA: NEGATIVE
Blood, UA: NEGATIVE
Glucose, UA: NEGATIVE
Ketones, UA: NEGATIVE
Leukocytes, UA: NEGATIVE
Nitrite, UA: NEGATIVE
Protein, UA: NEGATIVE
Spec Grav, UA: 1.01
Urobilinogen, UA: 0.2 U/dL
pH, UA: 6.5

## 2024-04-20 MED ORDER — SULFAMETHOXAZOLE-TRIMETHOPRIM 800-160 MG PO TABS: 1.0000 | ORAL_TABLET | Freq: Two times a day (BID) | ORAL | 0 refills | Status: AC

## 2024-04-20 NOTE — Progress Notes (Unsigned)
" °  ° ° °  Acute visit   Patient: Adrian Arnold   DOB: 1971/01/20   54 y.o. Male  MRN: [id number] PCP: Myrla Jon HERO, MD   Chief Complaint  Patient presents with   Acute Visit    Started after christmas; lower abdominal pain x 1week pain has subsided a little. Wednesday morning got cold not able to stay warm possible fever. Chills, shaking. Otc: ibuprofen. States of some pressure when urinating, burning, with some difficulty but has improved.   Subjective    Discussed the use of AI scribe software for clinical note transcription with the patient, who gave verbal consent to proceed.  History of Present Illness            Review of Systems  Objective    BP 111/76   Pulse 65   Resp 14   Ht 5' 11.5 (1.816 m)   Wt 193 lb 3.2 oz (87.6 kg)   SpO2 99%   BMI 26.57 kg/m  Physical Exam    Results for orders placed or performed in visit on 04/20/24  POCT Urinalysis Dipstick  Result Value Ref Range   Color, UA yellow    Clarity, UA clear    Glucose, UA Negative Negative   Bilirubin, UA Negative    Ketones, UA Negative    Spec Grav, UA 1.010 1.010 - 1.025   Blood, UA Negative    pH, UA 6.5 5.0 - 8.0   Protein, UA Negative Negative   Urobilinogen, UA 0.2 0.2 or 1.0 E.U./dL   Nitrite, UA Negative    Leukocytes, UA Negative Negative   Appearance     Odor      Assessment & Plan     Problem List Items Addressed This Visit   None Visit Diagnoses       Generalized abdominal pain    -  Primary   Relevant Orders   POCT Urinalysis Dipstick (Completed)                    No orders of the defined types were placed in this encounter.    No follow-ups on file.      Jon Myrla, MD  [facility] [phone] (phone) [phone] (fax)  [facility]  "

## 2024-04-21 ENCOUNTER — Encounter: Payer: Self-pay | Admitting: Family Medicine

## 2024-04-22 ENCOUNTER — Ambulatory Visit: Payer: Self-pay | Admitting: Family Medicine

## 2024-04-22 LAB — URINE CULTURE: Organism ID, Bacteria: NO GROWTH

## 2025-02-03 ENCOUNTER — Encounter: Admitting: Family Medicine
